# Patient Record
Sex: Female | Born: 1980
Health system: Southern US, Community
[De-identification: ages and names within clinical notes are randomized; demographics above are authoritative.]

## PROBLEM LIST (undated history)

## (undated) DIAGNOSIS — F988 Other specified behavioral and emotional disorders with onset usually occurring in childhood and adolescence: Secondary | ICD-10-CM

## (undated) DIAGNOSIS — Z8619 Personal history of other infectious and parasitic diseases: Secondary | ICD-10-CM

## (undated) DIAGNOSIS — N6001 Solitary cyst of right breast: Secondary | ICD-10-CM

## (undated) DIAGNOSIS — F419 Anxiety disorder, unspecified: Secondary | ICD-10-CM

## (undated) DIAGNOSIS — K649 Unspecified hemorrhoids: Secondary | ICD-10-CM

## (undated) DIAGNOSIS — R87612 Low grade squamous intraepithelial lesion on cytologic smear of cervix (LGSIL): Secondary | ICD-10-CM

## (undated) HISTORY — DX: Anxiety disorder, unspecified: F41.9

## (undated) HISTORY — DX: Low grade squamous intraepithelial lesion on cytologic smear of cervix (LGSIL): R87.612

## (undated) HISTORY — DX: Other specified behavioral and emotional disorders with onset usually occurring in childhood and adolescence: F98.8

## (undated) HISTORY — DX: Solitary cyst of right breast: N60.01

## (undated) HISTORY — DX: Unspecified hemorrhoids: K64.9

## (undated) HISTORY — PX: TONSILLECTOMY AND ADENOIDECTOMY: SUR1326

## (undated) HISTORY — DX: Personal history of other infectious and parasitic diseases: Z86.19

## (undated) HISTORY — PX: EXTERNAL EAR SURGERY: SHX627

---

## 2003-02-17 DIAGNOSIS — R87612 Low grade squamous intraepithelial lesion on cytologic smear of cervix (LGSIL): Secondary | ICD-10-CM

## 2003-02-17 HISTORY — DX: Low grade squamous intraepithelial lesion on cytologic smear of cervix (LGSIL): R87.612

## 2006-01-23 ENCOUNTER — Ambulatory Visit: Payer: Self-pay

## 2006-04-25 ENCOUNTER — Ambulatory Visit: Payer: Self-pay

## 2007-06-12 ENCOUNTER — Ambulatory Visit: Payer: Self-pay

## 2011-09-25 DIAGNOSIS — Z8619 Personal history of other infectious and parasitic diseases: Secondary | ICD-10-CM

## 2011-09-25 HISTORY — DX: Personal history of other infectious and parasitic diseases: Z86.19

## 2013-02-23 LAB — HM PAP SMEAR: HM PAP: NORMAL

## 2013-11-12 ENCOUNTER — Encounter: Payer: Self-pay | Admitting: Internal Medicine

## 2013-11-12 ENCOUNTER — Ambulatory Visit (INDEPENDENT_AMBULATORY_CARE_PROVIDER_SITE_OTHER): Payer: BC Managed Care – PPO | Admitting: Internal Medicine

## 2013-11-12 VITALS — BP 124/98 | HR 85 | Temp 98.7°F | Ht 69.25 in | Wt 174.0 lb

## 2013-11-12 DIAGNOSIS — F17200 Nicotine dependence, unspecified, uncomplicated: Secondary | ICD-10-CM

## 2013-11-12 DIAGNOSIS — E663 Overweight: Secondary | ICD-10-CM

## 2013-11-12 DIAGNOSIS — R202 Paresthesia of skin: Secondary | ICD-10-CM

## 2013-11-12 DIAGNOSIS — R209 Unspecified disturbances of skin sensation: Secondary | ICD-10-CM

## 2013-11-12 DIAGNOSIS — F909 Attention-deficit hyperactivity disorder, unspecified type: Secondary | ICD-10-CM

## 2013-11-12 DIAGNOSIS — F411 Generalized anxiety disorder: Secondary | ICD-10-CM

## 2013-11-12 NOTE — Progress Notes (Signed)
HPI  Pt presents to the clinic today to establish care. She does not have a PCP. She gets most of her care from [facility]. She does have some concerns today about facial pain. She report the left side of her face has been tingling for 2 days. The tingling has stopped. She has not broken out in a rash. She does have a history of shingles.  Flu: 07/25/2013 Tetanus: more than 10 years ago LMP: 10/29/13 Pap Smear: 2014 Dentist: unsure  Past Medical History  Diagnosis Date  . History of shingles 2013    Current Outpatient Prescriptions  Medication Sig Dispense Refill  . nicotine polacrilex (NICORETTE) 4 MG gum Take 4 mg by mouth as needed for smoking cessation.       No current facility-administered medications for this visit.    Allergies  Allergen Reactions  . Augmentin [Amoxicillin-Pot Clavulanate] Rash  . Codeine Nausea And Vomiting and Rash    Family History  Problem Relation Age of Onset  . Heart disease Paternal Grandmother   . Heart disease Paternal Grandfather     History   Social History  . Marital Status: Single    Spouse Name: N/A    Number of Children: N/A  . Years of Education: N/A   Occupational History  . Not on file.   Social History Main Topics  . Smoking status: Current Every Day Smoker -- 0.25 packs/day for 10 years    Types: Cigarettes  . Smokeless tobacco: Never Used     Comment: patient is using Nicorette  . Alcohol Use: Yes     Comment: moderate  . Drug Use: No  . Sexual Activity: Not on file   Other Topics Concern  . Not on file   Social History Narrative  . No narrative on file    ROS:  Constitutional: Denies fever, malaise, fatigue, headache or abrupt weight changes.  Respiratory: Denies difficulty breathing, shortness of breath, cough or sputum production.   Cardiovascular: Denies chest pain, chest tightness, palpitations or swelling in the hands or feet.  Skin: Denies redness, rashes, lesions or ulcercations.   Neurological: Pt reports tingling sensation of face. Denies dizziness, difficulty with memory, difficulty with speech or problems with balance and coordination.   No other specific complaints in a complete review of systems (except as listed in HPI above).  PE:  BP 124/98  Pulse 85  Temp(Src) 98.7 F (37.1 C) (Oral)  Ht 5' 9.25" (1.759 m)  Wt 174 lb (78.926 kg)  BMI 25.51 kg/m2  SpO2 99%  LMP 10/29/2013 Wt Readings from Last 3 Encounters:  11/12/13 174 lb (78.926 kg)    General: Appears her stated age, well developed, well nourished in NAD. Cardiovascular: Normal rate and rhythm. S1,S2 noted.  No murmur, rubs or gallops noted. No JVD or BLE edema. No carotid bruits noted. Pulmonary/Chest: Normal effort and positive vesicular breath sounds. No respiratory distress. No wheezes, rales or ronchi noted. Skin: Warm, dry and intact. No rashes, lesions or ulcerations noted.  Neurological: Alert and oriented. Cranial nerves II-XII intact. Coordination normal. +DTRs bilaterally. Psychiatric: Mood and affect normal. Behavior is normal. Judgment and thought content normal.      Assessment and Plan:  Paresthesia of face:  I do not think she is having a recurrent shingles episode ? Stress related No indication for valtrex at this time Advised her that she could not get the shingles vaccine until > 60  Health Maintenance:   She declines Tdap today Advised her  to work on diet and exercise Encouraged her to keep using the nicorette to quit smoking  RTC in 6 months for your physical exam

## 2013-11-12 NOTE — Patient Instructions (Addendum)
Paresthesia °Paresthesia is an abnormal burning or prickling sensation. This sensation is generally felt in the hands, arms, legs, or feet. However, it may occur in any part of the body. It is usually not painful. The feeling may be described as: °· Tingling or numbness. °· "Pins and needles." °· Skin crawling. °· Buzzing. °· Limbs "falling asleep." °· Itching. °Most people experience temporary (transient) paresthesia at some time in their lives. °CAUSES  °Paresthesia may occur when you breathe too quickly (hyperventilation). It can also occur without any apparent cause. Commonly, paresthesia occurs when pressure is placed on a nerve. The feeling quickly goes away once the pressure is removed. For some people, however, paresthesia is a long-lasting (chronic) condition caused by an underlying disorder. The underlying disorder may be: °· A traumatic, direct injury to nerves. Examples include a: °· Broken (fractured) neck. °· Fractured skull. °· A disorder affecting the brain and spinal cord (central nervous system). Examples include: °· Transverse myelitis. °· Encephalitis. °· Transient ischemic attack. °· Multiple sclerosis. °· Stroke. °· Tumor or blood vessel problems, such as an arteriovenous malformation pressing against the brain or spinal cord. °· A condition that damages the peripheral nerves (peripheral neuropathy). Peripheral nerves are not part of the brain and spinal cord. These conditions include: °· Diabetes. °· Peripheral vascular disease. °· Nerve entrapment syndromes, such as carpal tunnel syndrome. °· Shingles. °· Hypothyroidism. °· Vitamin B12 deficiencies. °· Alcoholism. °· Heavy metal poisoning (lead, arsenic). °· Rheumatoid arthritis. °· Systemic lupus erythematosus. °DIAGNOSIS  °Your caregiver will attempt to find the underlying cause of your paresthesia. Your caregiver may: °· Take your medical history. °· Perform a physical exam. °· Order various lab tests. °· Order imaging tests. °TREATMENT    °Treatment for paresthesia depends on the underlying cause. °HOME CARE INSTRUCTIONS °· Avoid drinking alcohol. °· You may consider massage or acupuncture to help relieve your symptoms. °· Keep all follow-up appointments as directed by your caregiver. °SEEK IMMEDIATE MEDICAL CARE IF:  °· You feel weak. °· You have trouble walking or moving. °· You have problems with speech or vision. °· You feel confused. °· You cannot control your bladder or bowel movements. °· You feel numbness after an injury. °· You faint. °· Your burning or prickling feeling gets worse when walking. °· You have pain, cramps, or dizziness. °· You develop a rash. °MAKE SURE YOU: °· Understand these instructions. °· Will watch your condition. °· Will get help right away if you are not doing well or get worse. °Document Released: 08/31/2002 Document Revised: 12/03/2011 Document Reviewed: 06/01/2011 °ExitCare® Patient Information ©2014 [employer]. ° °

## 2013-11-12 NOTE — Assessment & Plan Note (Signed)
She is using holistic treatments at this time She is not interested in medicinal treatment I think she would benefit from CBT

## 2013-11-12 NOTE — Assessment & Plan Note (Signed)
Was on Adderall for the last 10 years She is not interested in restarting it

## 2013-11-12 NOTE — Progress Notes (Signed)
Pre-visit discussion using our clinic review tool. No additional management support is needed unless otherwise documented below in the visit note.  

## 2014-05-27 ENCOUNTER — Emergency Department: Payer: Self-pay | Admitting: Emergency Medicine

## 2014-05-27 LAB — CBC WITH DIFFERENTIAL/PLATELET
BASOS ABS: 0.1 10*3/uL (ref 0.0–0.1)
Basophil %: 0.7 %
EOS ABS: 0.3 10*3/uL (ref 0.0–0.7)
EOS PCT: 2.4 %
HCT: 51 % — ABNORMAL HIGH (ref 35.0–47.0)
HGB: 16.5 g/dL — AB (ref 12.0–16.0)
LYMPHS PCT: 32.8 %
Lymphocyte #: 3.8 10*3/uL — ABNORMAL HIGH (ref 1.0–3.6)
MCH: 31 pg (ref 26.0–34.0)
MCHC: 32.4 g/dL (ref 32.0–36.0)
MCV: 96 fL (ref 80–100)
MONO ABS: 0.7 x10 3/mm (ref 0.2–0.9)
Monocyte %: 5.9 %
Neutrophil #: 6.7 10*3/uL — ABNORMAL HIGH (ref 1.4–6.5)
Neutrophil %: 58.2 %
Platelet: 352 10*3/uL (ref 150–440)
RBC: 5.33 10*6/uL — ABNORMAL HIGH (ref 3.80–5.20)
RDW: 12.6 % (ref 11.5–14.5)
WBC: 11.6 10*3/uL — AB (ref 3.6–11.0)

## 2014-05-27 LAB — COMPREHENSIVE METABOLIC PANEL
ALBUMIN: 4.1 g/dL (ref 3.4–5.0)
ALT: 21 U/L
ANION GAP: 10 (ref 7–16)
Alkaline Phosphatase: 59 U/L
BUN: 9 mg/dL (ref 7–18)
Bilirubin,Total: 0.5 mg/dL (ref 0.2–1.0)
Calcium, Total: 8.9 mg/dL (ref 8.5–10.1)
Chloride: 107 mmol/L (ref 98–107)
Co2: 24 mmol/L (ref 21–32)
Creatinine: 0.84 mg/dL (ref 0.60–1.30)
EGFR (African American): >60
EGFR (Non-African Amer.): >60
Glucose: 96 mg/dL (ref 65–99)
Osmolality: 280 (ref 275–301)
Potassium: 3.5 mmol/L (ref 3.5–5.1)
SGOT(AST): 21 U/L (ref 15–37)
SODIUM: 141 mmol/L (ref 136–145)
Total Protein: 7.1 g/dL (ref 6.4–8.2)

## 2014-05-27 LAB — PREGNANCY, URINE: Pregnancy Test, Urine: NEGATIVE m[IU]/mL

## 2014-05-27 LAB — LIPASE, BLOOD: Lipase: 153 U/L (ref 73–393)

## 2014-05-27 LAB — URINALYSIS, COMPLETE
Bacteria: NONE SEEN
Bilirubin,UR: NEGATIVE
GLUCOSE, UR: NEGATIVE mg/dL (ref 0–75)
KETONE: NEGATIVE
Leukocyte Esterase: NEGATIVE
NITRITE: NEGATIVE
PH: 6 (ref 4.5–8.0)
PROTEIN: NEGATIVE
SPECIFIC GRAVITY: 1.017 (ref 1.003–1.030)
WBC UR: 1 /HPF (ref 0–5)

## 2014-05-27 LAB — ETHANOL: ETHANOL LVL: 26 mg/dL

## 2015-06-30 ENCOUNTER — Ambulatory Visit (INDEPENDENT_AMBULATORY_CARE_PROVIDER_SITE_OTHER): Payer: BLUE CROSS/BLUE SHIELD | Admitting: Internal Medicine

## 2015-06-30 ENCOUNTER — Ambulatory Visit (INDEPENDENT_AMBULATORY_CARE_PROVIDER_SITE_OTHER): Payer: BLUE CROSS/BLUE SHIELD

## 2015-06-30 ENCOUNTER — Encounter: Payer: Self-pay | Admitting: Internal Medicine

## 2015-06-30 VITALS — BP 120/88 | HR 87 | Temp 98.4°F | Wt 191.0 lb

## 2015-06-30 DIAGNOSIS — M79605 Pain in left leg: Secondary | ICD-10-CM | POA: Diagnosis not present

## 2015-06-30 NOTE — Progress Notes (Signed)
Subjective:    Patient ID: Julie Jimenez, female    DOB: 11-09-1980, 34 y.o.   MRN: [id number]  HPI  Pt presents to the clinic today with c/o left leg pain. She noticed this 1 week ago. She describes the pain as a pinching and stinging sensation behind her left knee. The pain can occur at rest or while walking. She has not noticed any swelling, numbness or tingling in the left leg. She denies any injury to the area. She has been wearing a compression stocking with some relief. She is concerned that she may have a DVT in her leg because she is a smoker. She is feeling very anxious about this. She does feel a flutter in her chest over the last week but denies chest pain or shortness of breath. She does have anxiety but has been treating herself homeopathically  for this.  Review of Systems      Past Medical History  Diagnosis Date  . History of shingles 2013    Current Outpatient Prescriptions  Medication Sig Dispense Refill  . nicotine polacrilex (NICORETTE) 4 MG gum Take 4 mg by mouth as needed for smoking cessation.     No current facility-administered medications for this visit.    Allergies  Allergen Reactions  . Augmentin [Amoxicillin-Pot Clavulanate] Rash  . Codeine Nausea And Vomiting and Rash    Family History  Problem Relation Age of Onset  . Heart disease Paternal Grandmother   . Heart disease Paternal Grandfather     Social History   Social History  . Marital Status: Single    Spouse Name: N/A  . Number of Children: N/A  . Years of Education: N/A   Occupational History  . Not on file.   Social History Main Topics  . Smoking status: Current Every Day Smoker -- 0.50 packs/day for 10 years    Types: Cigarettes  . Smokeless tobacco: Never Used     Comment: patient is using Nicorette  . Alcohol Use: 0.0 oz/week    0 Standard drinks or equivalent per week     Comment: moderate  . Drug Use: No  . Sexual Activity: Not on file   Other Topics Concern  . Not  on file   Social History Narrative     Constitutional: Denies fever, malaise, fatigue, headache or abrupt weight changes.  Respiratory: Denies difficulty breathing, shortness of breath, cough or sputum production.   Cardiovascular: Denies chest pain, chest tightness, palpitations or swelling in the hands or feet.  Musculoskeletal: Pt reports left leg pain. Denies decrease in range of motion, difficulty with gait, muscle pain or joint pain and swelling.  Skin: Denies redness, rashes, lesions or ulcercations.  Psych: Pt reports anxiety. Denies depression, SI/HI.  No other specific complaints in a complete review of systems (except as listed in HPI above).  Objective:   Physical Exam   BP 120/88 mmHg  Pulse 87  Temp(Src) 98.4 F (36.9 C) (Oral)  Wt 191 lb (86.637 kg)  SpO2 98%  LMP 06/26/2015 Wt Readings from Last 3 Encounters:  06/30/15 191 lb (86.637 kg)  11/12/13 174 lb (78.926 kg)    General: Appears her stated age, well developed, well nourished in NAD. Skin: Warm, dry and intact. No redness or warmth noted. Cardiovascular: Normal rate and rhythm. S1,S2 noted. Negative Homan's sign.  Pulmonary/Chest: Normal effort and positive vesicular breath sounds. No respiratory distress. No wheezes, rales or ronchi noted.  Musculoskeletal: Normal flexion, extension of the left knee.  No pain with palpation of the popliteal fossa. No difficulty with gait.  Neurological: Alert and oriented.  Psychiatric: She is very anxious today.  BMET    Component Value Date/Time   NA 141 05/27/2014 0122   K 3.5 05/27/2014 0122   CL 107 05/27/2014 0122   CO2 24 05/27/2014 0122   GLUCOSE 96 05/27/2014 0122   BUN 9 05/27/2014 0122   CREATININE 0.84 05/27/2014 0122   CALCIUM 8.9 05/27/2014 0122   GFRNONAA >60 05/27/2014 0122   GFRAA >60 05/27/2014 0122    Lipid Panel  No results found for: CHOL, TRIG, HDL, CHOLHDL, VLDL, LDLCALC  CBC    Component Value Date/Time   WBC 11.6* 05/27/2014  0122   RBC 5.33* 05/27/2014 0122   HGB 16.5* 05/27/2014 0122   HCT 51.0* 05/27/2014 0122   PLT 352 05/27/2014 0122   MCV 96 05/27/2014 0122   MCH 31.0 05/27/2014 0122   MCHC 32.4 05/27/2014 0122   RDW 12.6 05/27/2014 0122   LYMPHSABS 3.8* 05/27/2014 0122   MONOABS 0.7 05/27/2014 0122   EOSABS 0.3 05/27/2014 0122   BASOSABS 0.1 05/27/2014 0122    Hgb A1C No results found for: HGBA1C      Assessment & Plan:   Left leg pain:  I really think this is benign She is insistent on getting a ultrasound to r/o DVT Ultrasound ordered today- see Shirlee Limerick on the way out to schedule  Will follow up after ultrasound, RTC as needed or if symptoms persist or worsen

## 2015-06-30 NOTE — Patient Instructions (Signed)
Leg Cramps Leg cramps occur when a muscle or muscles tighten and you have no control over this tightening (involuntary muscle contraction). Muscle cramps can develop in any muscle, but the most common place is in the calf muscles of the leg. Those cramps can occur during exercise or when you are at rest. Leg cramps are painful, and they may last for a few seconds to a few minutes. Cramps may return several times before they finally stop. Usually, leg cramps are not caused by a serious medical problem. In many cases, the cause is not known. Some common causes include:  Overexertion.  Overuse from repetitive motions, or doing the same thing over and over.  Remaining in a certain position for a long period of time.  Improper preparation, form, or technique while performing a sport or an activity.  Dehydration.  Injury.  Side effects of some medicines.  Abnormally low levels of the salts and ions in your blood (electrolytes), especially potassium and calcium. These levels could be low if you are taking water pills (diuretics) or if you are pregnant. HOME CARE INSTRUCTIONS Watch your condition for any changes. Taking the following actions may help to lessen any discomfort that you are feeling:  Stay well-hydrated. Drink enough fluid to keep your urine clear or pale yellow.  Try massaging, stretching, and relaxing the affected muscle. Do this for several minutes at a time.  For tight or tense muscles, use a warm towel, heating pad, or hot shower water directed to the affected area.  If you are sore or have pain after a cramp, applying ice to the affected area may relieve discomfort.  Put ice in a plastic bag.  Place a towel between your skin and the bag.  Leave the ice on for 20 minutes, 2-3 times per day.  Avoid strenuous exercise for several days if you have been having frequent leg cramps.  Make sure that your diet includes the essential minerals for your muscles to work  normally.  Take medicines only as directed by your health care provider. SEEK MEDICAL CARE IF:  Your leg cramps get more severe or more frequent, or they do not improve over time.  Your foot becomes cold, numb, or blue.   This information is not intended to replace advice given to you by your health care provider. Make sure you discuss any questions you have with your health care provider.   Document Released: 10/18/2004 Document Revised: 01/25/2015 Document Reviewed: 08/18/2014 Elsevier Interactive Patient Education 2016 [employer]   

## 2015-06-30 NOTE — Progress Notes (Signed)
   Subjective:    Patient ID: Julie Jimenez, female    DOB: 11/26/1980, 34 y.o.   MRN: [id number]  HPI [name] is a 34 year old female who presents today with chief complaint of pain in the left leg behind her knee.  Pain began 2 weeks ago and she describes the pain as "pinching" and dull.  She does not know if it is worse with walking or with rest. She is very concerned that she has a DVT and has a very high anxiety level.  She is a current smoker.  She has not taken anything over the counter for the pain.    Review of Systems  Constitutional: Negative for fever, chills and appetite change.  HENT: Negative for congestion, postnasal drip, rhinorrhea and sore throat.   Respiratory: Negative for cough, shortness of breath and wheezing.   Cardiovascular: Negative for chest pain, palpitations and leg swelling.  Gastrointestinal: Negative for abdominal pain.  Genitourinary: Negative for dysuria, frequency and flank pain.  Musculoskeletal: Negative for myalgias and arthralgias.  Skin: Negative.    Family History  Problem Relation Age of Onset  . Heart disease Paternal Grandmother   . Heart disease Paternal Grandfather    Current Outpatient Prescriptions on File Prior to Visit  Medication Sig Dispense Refill  . nicotine polacrilex (NICORETTE) 4 MG gum Take 4 mg by mouth as needed for smoking cessation.     No current facility-administered medications on file prior to visit.       Objective:   Physical Exam  Constitutional: She is oriented to person, place, and time. She appears well-developed and well-nourished. No distress.  HENT:  Head: Normocephalic and atraumatic.  Right Ear: External ear normal.  Left Ear: External ear normal.  Mouth/Throat: No oropharyngeal exudate.  Neck: Normal range of motion. Neck supple.  Cardiovascular: Normal rate, regular rhythm, normal heart sounds and intact distal pulses.   No murmur heard. Pulmonary/Chest: Effort normal and breath sounds normal.    Abdominal: Soft. Bowel sounds are normal.  Musculoskeletal: Normal range of motion.  Lymphadenopathy:    She has no cervical adenopathy.  Neurological: She is alert and oriented to person, place, and time.  Skin: Skin is warm and dry. She is not diaphoretic. No erythema.    BP 120/88 mmHg  Pulse 87  Temp(Src) 98.4 F (36.9 C) (Oral)  Wt 191 lb (86.637 kg)  SpO2 98%  LMP 06/26/2015       Assessment & Plan:  1. Left leg pain Will doppler to rule out DVT.  May take Ibuprofen for pain.

## 2015-06-30 NOTE — Progress Notes (Signed)
Pre visit review using our clinic review tool, if applicable. No additional management support is needed unless otherwise documented below in the visit note. 

## 2015-07-08 ENCOUNTER — Ambulatory Visit (INDEPENDENT_AMBULATORY_CARE_PROVIDER_SITE_OTHER): Payer: BLUE CROSS/BLUE SHIELD | Admitting: Internal Medicine

## 2015-07-08 ENCOUNTER — Encounter: Payer: Self-pay | Admitting: Internal Medicine

## 2015-07-08 VITALS — BP 120/86 | HR 85 | Temp 98.4°F | Ht 69.0 in | Wt 188.0 lb

## 2015-07-08 DIAGNOSIS — Z0000 Encounter for general adult medical examination without abnormal findings: Secondary | ICD-10-CM | POA: Diagnosis not present

## 2015-07-08 LAB — CBC
HCT: 46.7 % — ABNORMAL HIGH (ref 36.0–46.0)
Hemoglobin: 15.9 g/dL — ABNORMAL HIGH (ref 12.0–15.0)
MCH: 30.7 pg (ref 26.0–34.0)
MCHC: 34 g/dL (ref 30.0–36.0)
MCV: 90.2 fL (ref 78.0–100.0)
MPV: 9.2 fL (ref 8.6–12.4)
Platelets: 348 10*3/uL (ref 150–400)
RBC: 5.18 MIL/uL — ABNORMAL HIGH (ref 3.87–5.11)
RDW: 13.3 % (ref 11.5–15.5)
WBC: 12.1 10*3/uL — AB (ref 4.0–10.5)

## 2015-07-08 LAB — HEMOGLOBIN A1C
HEMOGLOBIN A1C: 5.1 % (ref <5.7)
Mean Plasma Glucose: 100 mg/dL (ref <117)

## 2015-07-08 LAB — LIPID PANEL
CHOL/HDL RATIO: 3.9 ratio (ref <=5.0)
Cholesterol: 174 mg/dL (ref 125–200)
HDL: 45 mg/dL — AB (ref >=46)
LDL CALC: 119 mg/dL (ref <130)
Triglycerides: 50 mg/dL (ref <150)
VLDL: 10 mg/dL (ref <30)

## 2015-07-08 LAB — COMPREHENSIVE METABOLIC PANEL
ALK PHOS: 64 U/L (ref 33–115)
ALT: 20 U/L (ref 6–29)
AST: 22 U/L (ref 10–30)
Albumin: 4.3 g/dL (ref 3.6–5.1)
BUN: 5 mg/dL — AB (ref 7–25)
CO2: 21 mmol/L (ref 20–31)
Calcium: 9.3 mg/dL (ref 8.6–10.2)
Chloride: 102 mmol/L (ref 98–110)
Creat: 0.7 mg/dL (ref 0.50–1.10)
GLUCOSE: 78 mg/dL (ref 65–99)
POTASSIUM: 4.3 mmol/L (ref 3.5–5.3)
Sodium: 139 mmol/L (ref 135–146)
Total Bilirubin: 0.8 mg/dL (ref 0.2–1.2)
Total Protein: 6.3 g/dL (ref 6.1–8.1)

## 2015-07-08 LAB — TSH: TSH: 2.471 u[IU]/mL (ref 0.350–4.500)

## 2015-07-08 NOTE — Addendum Note (Signed)
Addended by: Lorre MunroeBAITY, Tziporah Knoke W on: 07/08/2015 03:06 PM   Modules accepted: Kipp BroodSmartSet

## 2015-07-08 NOTE — Progress Notes (Signed)
Subjective:    Patient ID: Julie Jimenez, female    DOB: [birth year], 34 y.o.   MRN: [id number]  HPI  Pt presents to the clinic today for her annual exam.  Flu: 2014 Tetanus: > 10 years ago Pap Smear: 2014 Dentist: biannually  Diet: She tries to avoid dairy and sugar. She tries to avoid fried foods. She eats lots of organic fruits and veggies. She drinks mostly water and beer. Exercise: She does not exercise.  Review of Systems   Past Medical History  Diagnosis Date  . History of shingles 2013    Current Outpatient Prescriptions  Medication Sig Dispense Refill  . nicotine polacrilex (NICORETTE) 4 MG gum Take 4 mg by mouth as needed for smoking cessation.     No current facility-administered medications for this visit.    Allergies  Allergen Reactions  . Augmentin [Amoxicillin-Pot Clavulanate] Rash  . Codeine Nausea And Vomiting and Rash    Family History  Problem Relation Age of Onset  . Heart disease Paternal Grandmother   . Heart disease Paternal Grandfather     Social History   Social History  . Marital Status: Single    Spouse Name: N/A  . Number of Children: N/A  . Years of Education: N/A   Occupational History  . Not on file.   Social History Main Topics  . Smoking status: Current Every Day Smoker -- 0.50 packs/day for 10 years    Types: Cigarettes  . Smokeless tobacco: Never Used     Comment: patient is using Nicorette  . Alcohol Use: 0.0 oz/week    0 Standard drinks or equivalent per week     Comment: moderate  . Drug Use: No  . Sexual Activity: Not on file   Other Topics Concern  . Not on file   Social History Narrative     Constitutional: Denies fever, malaise, fatigue, headache or abrupt weight changes.  HEENT: Denies eye pain, eye redness, ear pain, ringing in the ears, wax buildup, runny nose, nasal congestion, bloody nose, or sore throat. Respiratory: Denies difficulty breathing, shortness of breath, cough or sputum production.     Cardiovascular: Denies chest pain, chest tightness, palpitations or swelling in the hands or feet.  Gastrointestinal: Denies abdominal pain, bloating, constipation, diarrhea or blood in the stool.  GU: Denies urgency, frequency, pain with urination, burning sensation, blood in urine, odor or discharge. Musculoskeletal: Denies decrease in range of motion, difficulty with gait, muscle pain or joint pain and swelling.  Skin: Denies redness, rashes, lesions or ulcercations.  Neurological: Denies dizziness, difficulty with memory, difficulty with speech or problems with balance and coordination.  Psych: Pt reports anxiety. Denies depression, SI/HI.  No other specific complaints in a complete review of systems (except as listed in HPI above).     Objective:   Physical Exam  BP 120/86 mmHg  Pulse 85  Temp(Src) 98.4 F (36.9 C) (Oral)  Ht  (1.753 m)  Wt 188 lb (85.276 kg)  BMI 27.75 kg/m2  SpO2 99%  LMP 06/26/2015 Wt Readings from Last 3 Encounters:  07/08/15 188 lb (85.276 kg)  06/30/15 191 lb (86.637 kg)  11/12/13 174 lb (78.926 kg)    General: Appears her stated age, well developed, well nourished in NAD. Skin: Warm, dry and intact. No rashes, lesions or ulcerations noted. HEENT: Head: normal shape and size; Eyes: sclera white, no icterus, conjunctiva pink, PERRLA and EOMs intact; Ears: Tm's gray and intact, normal light reflex;  Throat/Mouth: Teeth  present, mucosa pink and moist, no exudate, lesions or ulcerations noted.  Neck:  Neck supple, trachea midline. No masses, lumps or thyromegaly present.  Cardiovascular: Normal rate and rhythm. S1,S2 noted.  No murmur, rubs or gallops noted. No JVD or BLE edema. No carotid bruits noted. Pulmonary/Chest: Normal effort and positive vesicular breath sounds. No respiratory distress. No wheezes, rales or ronchi noted.  Abdomen: Soft and nontender. Normal bowel sounds. No distention or masses noted. Liver, spleen and kidneys non  palpable. Musculoskeletal: Strength 5/5 BUE/BLE. No signs of joint swelling. No difficulty with gait.  Neurological: Alert and oriented. Cranial nerves II-XII grossly intact. Coordination normal.  Psychiatric: Mood and affect normal. Behavior is normal. Judgment and thought content normal.     BMET    Component Value Date/Time   NA 141 05/27/2014 0122   K 3.5 05/27/2014 0122   CL 107 05/27/2014 0122   CO2 24 05/27/2014 0122   GLUCOSE 96 05/27/2014 0122   BUN 9 05/27/2014 0122   CREATININE 0.84 05/27/2014 0122   CALCIUM 8.9 05/27/2014 0122   GFRNONAA >60 05/27/2014 0122   GFRAA >60 05/27/2014 0122    Lipid Panel  No results found for: CHOL, TRIG, HDL, CHOLHDL, VLDL, LDLCALC  CBC    Component Value Date/Time   WBC 11.6* 05/27/2014 0122   RBC 5.33* 05/27/2014 0122   HGB 16.5* 05/27/2014 0122   HCT 51.0* 05/27/2014 0122   PLT 352 05/27/2014 0122   MCV 96 05/27/2014 0122   MCH 31.0 05/27/2014 0122   MCHC 32.4 05/27/2014 0122   RDW 12.6 05/27/2014 0122   LYMPHSABS 3.8* 05/27/2014 0122   MONOABS 0.7 05/27/2014 0122   EOSABS 0.3 05/27/2014 0122   BASOSABS 0.1 05/27/2014 0122    Hgb A1C No results found for: HGBA1C        Assessment & Plan:   Preventative Health Maintenance:  She declines flu and Tdap today Encourage her to consume a balanced diet and start an exercise regimen Stop smoking She will have her pap at [facility]YN Encouraged her to see an dentist at least bianually CBC, CMET, Lipid, A1C and TSH today  RTC in 1 year or sooner if needed

## 2015-07-08 NOTE — Patient Instructions (Signed)
Health Maintenance, Female Adopting a healthy lifestyle and getting preventive care can go a long way to promote health and wellness. Talk with your health care provider about what schedule of regular examinations is right for you. This is a good chance for you to check in with your provider about disease prevention and staying healthy. In between checkups, there are plenty of things you can do on your own. Experts have done a lot of research about which lifestyle changes and preventive measures are most likely to keep you healthy. Ask your health care provider for more information. WEIGHT AND DIET  Eat a healthy diet  Be sure to include plenty of vegetables, fruits, low-fat dairy products, and lean protein.  Do not eat a lot of foods high in solid fats, added sugars, or salt.  Get regular exercise. This is one of the most important things you can do for your health.  Most adults should exercise for at least 150 minutes each week. The exercise should increase your heart rate and make you sweat (moderate-intensity exercise).  Most adults should also do strengthening exercises at least twice a week. This is in addition to the moderate-intensity exercise.  Maintain a healthy weight  Body mass index (BMI) is a measurement that can be used to identify possible weight problems. It estimates body fat based on height and weight. Your health care provider can help determine your BMI and help you achieve or maintain a healthy weight.  For females 60 years of age and older:   A BMI below 18.5 is considered underweight.  A BMI of 18.5 to 24.9 is normal.  A BMI of 25 to 29.9 is considered overweight.  A BMI of 30 and above is considered obese.  Watch levels of cholesterol and blood lipids  You should start having your blood tested for lipids and cholesterol at 34 years of age, then have this test every 5 years.  You may need to have your cholesterol levels checked more often if:  Your lipid  or cholesterol levels are high.  You are older than 34 years of age.  You are at high risk for heart disease.  CANCER SCREENING   Lung Cancer  Lung cancer screening is recommended for adults 4-56 years old who are at high risk for lung cancer because of a history of smoking.  A yearly low-dose CT scan of the lungs is recommended for people who:  Currently smoke.  Have quit within the past 15 years.  Have at least a 30-pack-year history of smoking. A pack year is smoking an average of one pack of cigarettes a day for 1 year.  Yearly screening should continue until it has been 15 years since you quit.  Yearly screening should stop if you develop a health problem that would prevent you from having lung cancer treatment.  Breast Cancer  Practice breast self-awareness. This means understanding how your breasts normally appear and feel.  It also means doing regular breast self-exams. Let your health care provider know about any changes, no matter how small.  If you are in your 20s or 30s, you should have a clinical breast exam (CBE) by a health care provider every 1-3 years as part of a regular health exam.  If you are 56 or older, have a CBE every year. Also consider having a breast X-ray (mammogram) every year.  If you have a family history of breast cancer, talk to your health care provider about genetic screening.  If you  are at high risk for breast cancer, talk to your health care provider about having an MRI and a mammogram every year.  Breast cancer gene (BRCA) assessment is recommended for women who have family members with BRCA-related cancers. BRCA-related cancers include:  Breast.  Ovarian.  Tubal.  Peritoneal cancers.  Results of the assessment will determine the need for genetic counseling and BRCA1 and BRCA2 testing. Cervical Cancer Your health care provider may recommend that you be screened regularly for cancer of the pelvic organs (ovaries, uterus, and  vagina). This screening involves a pelvic examination, including checking for microscopic changes to the surface of your cervix (Pap test). You may be encouraged to have this screening done every 3 years, beginning at age 109.  For women ages 12-65, health care providers may recommend pelvic exams and Pap testing every 3 years, or they may recommend the Pap and pelvic exam, combined with testing for human papilloma virus (HPV), every 5 years. Some types of HPV increase your risk of cervical cancer. Testing for HPV may also be done on women of any age with unclear Pap test results.  Other health care providers may not recommend any screening for nonpregnant women who are considered low risk for pelvic cancer and who do not have symptoms. Ask your health care provider if a screening pelvic exam is right for you.  If you have had past treatment for cervical cancer or a condition that could lead to cancer, you need Pap tests and screening for cancer for at least 20 years after your treatment. If Pap tests have been discontinued, your risk factors (such as having a new sexual partner) need to be reassessed to determine if screening should resume. Some women have medical problems that increase the chance of getting cervical cancer. In these cases, your health care provider may recommend more frequent screening and Pap tests. Colorectal Cancer  This type of cancer can be detected and often prevented.  Routine colorectal cancer screening usually begins at 34 years of age and continues through 34 years of age.  Your health care provider may recommend screening at an earlier age if you have risk factors for colon cancer.  Your health care provider may also recommend using home test kits to check for hidden blood in the stool.  A small camera at the end of a tube can be used to examine your colon directly (sigmoidoscopy or colonoscopy). This is done to check for the earliest forms of colorectal  cancer.  Routine screening usually begins at age 17.  Direct examination of the colon should be repeated every 5-10 years through 34 years of age. However, you may need to be screened more often if early forms of precancerous polyps or small growths are found. Skin Cancer  Check your skin from head to toe regularly.  Tell your health care provider about any new moles or changes in moles, especially if there is a change in a mole's shape or color.  Also tell your health care provider if you have a mole that is larger than the size of a pencil eraser.  Always use sunscreen. Apply sunscreen liberally and repeatedly throughout the day.  Protect yourself by wearing long sleeves, pants, a wide-brimmed hat, and sunglasses whenever you are outside. HEART DISEASE, DIABETES, AND HIGH BLOOD PRESSURE   High blood pressure causes heart disease and increases the risk of stroke. High blood pressure is more likely to develop in:  People who have blood pressure in the high end  of the normal range (130-139/85-89 mm Hg).  People who are overweight or obese.  People who are African American.  If you are 57-56 years of age, have your blood pressure checked every 3-5 years. If you are 85 years of age or older, have your blood pressure checked every year. You should have your blood pressure measured twice--once when you are at a hospital or clinic, and once when you are not at a hospital or clinic. Record the average of the two measurements. To check your blood pressure when you are not at a hospital or clinic, you can use:  An automated blood pressure machine at a pharmacy.  A home blood pressure monitor.  If you are between 1 years and 87 years old, ask your health care provider if you should take aspirin to prevent strokes.  Have regular diabetes screenings. This involves taking a blood sample to check your fasting blood sugar level.  If you are at a normal weight and have a low risk for diabetes,  have this test once every three years after 34 years of age.  If you are overweight and have a high risk for diabetes, consider being tested at a younger age or more often. PREVENTING INFECTION  Hepatitis B  If you have a higher risk for hepatitis B, you should be screened for this virus. You are considered at high risk for hepatitis B if:  You were born in a country where hepatitis B is common. Ask your health care provider which countries are considered high risk.  Your parents were born in a high-risk country, and you have not been immunized against hepatitis B (hepatitis B vaccine).  You have HIV or AIDS.  You use needles to inject street drugs.  You live with someone who has hepatitis B.  You have had sex with someone who has hepatitis B.  You get hemodialysis treatment.  You take certain medicines for conditions, including cancer, organ transplantation, and autoimmune conditions. Hepatitis C  Blood testing is recommended for:  Everyone born from [birth year] through 1965.  Anyone with known risk factors for hepatitis C. Sexually transmitted infections (STIs)  You should be screened for sexually transmitted infections (STIs) including gonorrhea and chlamydia if:  You are sexually active and are younger than 34 years of age.  You are older than 34 years of age and your health care provider tells you that you are at risk for this type of infection.  Your sexual activity has changed since you were last screened and you are at an increased risk for chlamydia or gonorrhea. Ask your health care provider if you are at risk.  If you do not have HIV, but are at risk, it may be recommended that you take a prescription medicine daily to prevent HIV infection. This is called pre-exposure prophylaxis (PrEP). You are considered at risk if:  You are sexually active and do not regularly use condoms or know the HIV status of your partner(s).  You take drugs by injection.  You are sexually  active with a partner who has HIV. Talk with your health care provider about whether you are at high risk of being infected with HIV. If you choose to begin PrEP, you should first be tested for HIV. You should then be tested every 3 months for as long as you are taking PrEP.  PREGNANCY   If you are premenopausal and you may become pregnant, ask your health care provider about preconception counseling.  If you may  become pregnant, take 400 to 800 micrograms (mcg) of folic acid every day.  If you want to prevent pregnancy, talk to your health care provider about birth control (contraception). OSTEOPOROSIS AND MENOPAUSE   Osteoporosis is a disease in which the bones lose minerals and strength with aging. This can result in serious bone fractures. Your risk for osteoporosis can be identified using a bone density scan.  If you are 61 years of age or older, or if you are at risk for osteoporosis and fractures, ask your health care provider if you should be screened.  Ask your health care provider whether you should take a calcium or vitamin D supplement to lower your risk for osteoporosis.  Menopause may have certain physical symptoms and risks.  Hormone replacement therapy may reduce some of these symptoms and risks. Talk to your health care provider about whether hormone replacement therapy is right for you.  HOME CARE INSTRUCTIONS   Schedule regular health, dental, and eye exams.  Stay current with your immunizations.   Do not use any tobacco products including cigarettes, chewing tobacco, or electronic cigarettes.  If you are pregnant, do not drink alcohol.  If you are breastfeeding, limit how much and how often you drink alcohol.  Limit alcohol intake to no more than 1 drink per day for nonpregnant women. One drink equals 12 ounces of beer, 5 ounces of wine, or 1 ounces of hard liquor.  Do not use street drugs.  Do not share needles.  Ask your health care provider for help if  you need support or information about quitting drugs.  Tell your health care provider if you often feel depressed.  Tell your health care provider if you have ever been abused or do not feel safe at home.   This information is not intended to replace advice given to you by your health care provider. Make sure you discuss any questions you have with your health care provider.   Document Released: 03/26/2011 Document Revised: 10/01/2014 Document Reviewed: 08/12/2013 Elsevier Interactive Patient Education [employer].

## 2015-07-11 ENCOUNTER — Encounter: Payer: Self-pay | Admitting: Internal Medicine

## 2015-08-02 ENCOUNTER — Encounter: Payer: Self-pay | Admitting: Family Medicine

## 2015-08-02 ENCOUNTER — Ambulatory Visit (INDEPENDENT_AMBULATORY_CARE_PROVIDER_SITE_OTHER): Payer: BLUE CROSS/BLUE SHIELD | Admitting: Family Medicine

## 2015-08-02 VITALS — BP 110/82 | HR 92 | Temp 98.9°F | Ht 69.0 in | Wt 186.8 lb

## 2015-08-02 DIAGNOSIS — R202 Paresthesia of skin: Secondary | ICD-10-CM | POA: Diagnosis not present

## 2015-08-02 DIAGNOSIS — R200 Anesthesia of skin: Secondary | ICD-10-CM

## 2015-08-02 NOTE — Assessment & Plan Note (Signed)
No red flags for CVA etc. No indication for brain imaging at this time. Nml TSH.  Does drink excessive alcohol.. Decrease ETOH.  Stop smoking. May be related to anxiety..  Start daily B12 vitamin.  ? Compression neuropathy given new YOGA. No sign of CTS or ulnar neuropahty. Follow up in 1-2 months if not improving.

## 2015-08-02 NOTE — Progress Notes (Signed)
Subjective:    Patient ID: Julie Jimenez, female    DOB: July 25, 1981, 34 y.o.   MRN: [id number]  HPI 34 year old female pt of [place] with history of  ADHD, generalized anxiety currently on no medication presents with new onset tingling/numbness in left  hand and left leg in last 3 days. Symptoms are off and on, lasts few few hours a t a time.  No clear trigger but may be when she sits still.  No pain leg, nml strength in left leg and hand.  No low back pain, no  Pain in neck.   She reports she is a little bit of a hypochondriac. She is under stress lately.  She has cut back smoking.  She has started YOGA recently.  She had  Has one to two beer a night. Occ drinks 3-4 on weekends. 14 drinks a week. She does take B12 off and on.  TSH 2.471  Social History /Family History/Past Medical History reviewed and updated if needed.  Review of Systems  Constitutional: Negative for fever and fatigue.  HENT: Negative for ear pain.   Eyes: Negative for pain.  Respiratory: Negative for shortness of breath.   Cardiovascular: Negative for chest pain.       Objective:   Physical Exam  Constitutional: She is oriented to person, place, and time. Vital signs are normal. She appears well-developed and well-nourished. She is cooperative.  Non-toxic appearance. She does not appear ill. No distress.  HENT:  Head: Normocephalic.  Right Ear: Hearing, tympanic membrane, external ear and ear canal normal. Tympanic membrane is not erythematous, not retracted and not bulging.  Left Ear: Hearing, tympanic membrane, external ear and ear canal normal. Tympanic membrane is not erythematous, not retracted and not bulging.  Nose: No mucosal edema or rhinorrhea. Right sinus exhibits no maxillary sinus tenderness and no frontal sinus tenderness. Left sinus exhibits no maxillary sinus tenderness and no frontal sinus tenderness.  Mouth/Throat: Uvula is midline, oropharynx is clear and moist and mucous membranes  are normal.  Eyes: Conjunctivae, EOM and lids are normal. Pupils are equal, round, and reactive to light. Lids are everted and swept, no foreign bodies found.  Neck: Trachea normal and normal range of motion. Neck supple. Carotid bruit is not present. No thyroid mass and no thyromegaly present.  Cardiovascular: Normal rate, regular rhythm, S1 normal, S2 normal, normal heart sounds, intact distal pulses and normal pulses.  Exam reveals no gallop and no friction rub.   No murmur heard. Pulses:      Radial pulses are 2+ on the right side, and 2+ on the left side.       Dorsalis pedis pulses are 2+ on the right side, and 2+ on the left side.       Posterior tibial pulses are 2+ on the right side, and 2+ on the left side.  Pulmonary/Chest: Effort normal and breath sounds normal. No tachypnea. No respiratory distress. She has no decreased breath sounds. She has no wheezes. She has no rhonchi. She has no rales.  Abdominal: Soft. Normal appearance and bowel sounds are normal. There is no tenderness.  Musculoskeletal:       Cervical back: She exhibits normal range of motion, no tenderness and no bony tenderness.       Lumbar back: She exhibits normal range of motion, no tenderness, no bony tenderness and no swelling.       Arms: Area of tenderness chronically where she had shingels.  Neurological:  She is alert and oriented to person, place, and time. She has normal strength. No cranial nerve deficit or sensory deficit. She exhibits normal muscle tone. She displays a negative Romberg sign. Coordination and gait normal.  Neg spurling bilaterally  Skin: Skin is warm, dry and intact. No rash noted.  Psychiatric: Her speech is normal and behavior is normal. Judgment and thought content normal. Her mood appears not anxious. Cognition and memory are normal. She does not exhibit a depressed mood.          Assessment & Plan:

## 2015-08-02 NOTE — Progress Notes (Signed)
Pre visit review using our clinic review tool, if applicable. No additional management support is needed unless otherwise documented below in the visit note. 

## 2015-08-02 NOTE — Patient Instructions (Addendum)
Decrease alcohol. Stop smoking. Can try to take B12  1000 mg daily. Decrease stress as able. Keep working on Yoga.

## 2015-08-04 ENCOUNTER — Ambulatory Visit: Payer: BLUE CROSS/BLUE SHIELD | Admitting: Internal Medicine

## 2016-02-24 ENCOUNTER — Encounter: Payer: Self-pay | Admitting: Primary Care

## 2016-02-24 ENCOUNTER — Ambulatory Visit (INDEPENDENT_AMBULATORY_CARE_PROVIDER_SITE_OTHER): Payer: BLUE CROSS/BLUE SHIELD | Admitting: Primary Care

## 2016-02-24 ENCOUNTER — Ambulatory Visit: Payer: BLUE CROSS/BLUE SHIELD | Admitting: Family Medicine

## 2016-02-24 VITALS — BP 114/80 | HR 76 | Temp 97.6°F | Ht 69.0 in | Wt 187.8 lb

## 2016-02-24 DIAGNOSIS — B029 Zoster without complications: Secondary | ICD-10-CM

## 2016-02-24 MED ORDER — VALACYCLOVIR HCL 1 G PO TABS: 1000.0000 mg | ORAL_TABLET | Freq: Three times a day (TID) | ORAL | Status: DC

## 2016-02-24 NOTE — Progress Notes (Signed)
Pre visit review using our clinic review tool, if applicable. No additional management support is needed unless otherwise documented below in the visit note. 

## 2016-02-24 NOTE — Progress Notes (Signed)
   Subjective:    Patient ID: Julie Jimenez, female    DOB: 1981-01-29, 35 y.o.   MRN: [id number]  HPI  Julie Jimenez is a 35 year old female who presents today with a chief complaint of tingling and pain. She has a history of shingles that 4 years ago, and her symptoms today feel the same.   Her symptoms have been present for 6 days and are located to the right side of her lower back. She experiences discomfort during showering, brushing a feather against her skin, being touched by anything. She's been under increased stress and had a fever blister earlier this week. She denies rash, fevers, chills.   Review of Systems  Constitutional: Negative for fever and chills.  Skin: Negative for rash.  Neurological:       Tingling and pain to skin of right lower back.       Past Medical History  Diagnosis Date  . History of shingles 2013     Social History   Social History  . Marital Status: Single    Spouse Name: N/A  . Number of Children: N/A  . Years of Education: N/A   Occupational History  . Not on file.   Social History Main Topics  . Smoking status: Current Some Day Smoker -- 0.50 packs/day for 10 years    Types: Cigarettes  . Smokeless tobacco: Never Used  . Alcohol Use: 0.0 oz/week    0 Standard drinks or equivalent per week     Comment: moderate  . Drug Use: No  . Sexual Activity: Not on file   Other Topics Concern  . Not on file   Social History Narrative    Past Surgical History  Procedure Laterality Date  . Tonsillectomy and adenoidectomy      Family History  Problem Relation Age of Onset  . Heart disease Paternal Grandmother   . Heart disease Paternal Grandfather     Allergies  Allergen Reactions  . Augmentin [Amoxicillin-Pot Clavulanate] Rash  . Codeine Nausea And Vomiting and Rash    No current outpatient prescriptions on file prior to visit.   No current facility-administered medications on file prior to visit.    BP 114/80 mmHg  Pulse 76   Temp(Src) 97.6 F (36.4 C) (Oral)  Ht 5\' 9"  (1.753 m)  Wt 187 lb 12.8 oz (85.186 kg)  BMI 27.72 kg/m2  SpO2 99%  LMP 02/11/2016    Objective:   Physical Exam  Constitutional: She appears well-nourished.  Cardiovascular: Normal rate and regular rhythm.   Pulmonary/Chest: Effort normal and breath sounds normal.  Skin: Skin is warm and dry. No rash noted. No erythema.  Moderately tender to light palpation. Pain located in specific dermatome pattern.          Assessment & Plan:  Pain and Tingling:  Present to right lower back x 6 days. Increased stress in personal life, fever blisters on exam today. Moderate pain to very light palpation in area of concern. Given history, symptoms, increased stress will treat for potential outbreak. Rx for Valtrex 7 day course sent to pharmacy. Return precautions provided.

## 2016-02-24 NOTE — Patient Instructions (Addendum)
Start Valtrex tablets for presumed shingles outbreak. Take 1 tablet by mouth three times daily for 7 days.  Please notify me if no improvement in symptoms after completion of medication.  It was a pleasure meeting you!  Shingles Shingles, which is also known as herpes zoster, is an infection that causes a painful skin rash and fluid-filled blisters. Shingles is not related to genital herpes, which is a sexually transmitted infection.   Shingles only develops in people who:  Have had chickenpox.  Have received the chickenpox vaccine. (This is rare.) CAUSES Shingles is caused by varicella-zoster virus (VZV). This is the same virus that causes chickenpox. After exposure to VZV, the virus stays in the body in an inactive (dormant) state. Shingles develops if the virus reactivates. This can happen many years after the initial exposure to VZV. It is not known what causes this virus to reactivate. RISK FACTORS People who have had chickenpox or received the chickenpox vaccine are at risk for shingles. Infection is more common in people who:  Are older than age 71.  Have a weakened defense (immune) system, such as those with HIV, AIDS, or cancer.  Are taking medicines that weaken the immune system, such as transplant medicines.  Are under great stress. SYMPTOMS Early symptoms of this condition include itching, tingling, and pain in an area on your skin. Pain may be described as burning, stabbing, or throbbing. A few days or weeks after symptoms start, a painful red rash appears, usually on one side of the body in a bandlike or beltlike pattern. The rash eventually turns into fluid-filled blisters that break open, scab over, and dry up in about 2-3 weeks. At any time during the infection, you may also develop:  A fever.  Chills.  A headache.  An upset stomach. DIAGNOSIS This condition is diagnosed with a skin exam. Sometimes, skin or fluid samples are taken from the blisters before a  diagnosis is made. These samples are examined under a microscope or sent to a lab for testing. TREATMENT There is no specific cure for this condition. Your health care provider will probably prescribe medicines to help you manage pain, recover more quickly, and avoid long-term problems. Medicines may include:  Antiviral drugs.  Anti-inflammatory drugs.  Pain medicines. If the area involved is on your face, you may be referred to a specialist, such as an eye doctor (ophthalmologist) or an ear, nose, and throat (ENT) doctor to help you avoid eye problems, chronic pain, or disability. HOME CARE INSTRUCTIONS Medicines  Take medicines only as directed by your health care provider.  Apply an anti-itch or numbing cream to the affected area as directed by your health care provider. Blister and Rash Care  Take a cool bath or apply cool compresses to the area of the rash or blisters as directed by your health care provider. This may help with pain and itching.  Keep your rash covered with a loose bandage (dressing). Wear loose-fitting clothing to help ease the pain of material rubbing against the rash.  Keep your rash and blisters clean with mild soap and cool water or as directed by your health care provider.  Check your rash every day for signs of infection. These include redness, swelling, and pain that lasts or increases.  Do not pick your blisters.  Do not scratch your rash. General Instructions  Rest as directed by your health care provider.  Keep all follow-up visits as directed by your health care provider. This is important.  Until  your blisters scab over, your infection can cause chickenpox in people who have never had it or been vaccinated against it. To prevent this from happening, avoid contact with other people, especially:  Babies.  Pregnant women.  Children who have eczema.  Elderly people who have transplants.  People who have chronic illnesses, such as leukemia or  AIDS. SEEK MEDICAL CARE IF:  Your pain is not relieved with prescribed medicines.  Your pain does not get better after the rash heals.  Your rash looks infected. Signs of infection include redness, swelling, and pain that lasts or increases. SEEK IMMEDIATE MEDICAL CARE IF:  The rash is on your face or nose.  You have facial pain, pain around your eye area, or loss of feeling on one side of your face.  You have ear pain or you have ringing in your ear.  You have loss of taste.  Your condition gets worse.   This information is not intended to replace advice given to you by your health care provider. Make sure you discuss any questions you have with your health care provider.   Document Released: 09/10/2005 Document Revised: 10/01/2014 Document Reviewed: 07/22/2014 Elsevier Interactive Patient Education [employer]2016 [employer]

## 2016-02-27 ENCOUNTER — Telehealth: Payer: Self-pay

## 2016-02-27 DIAGNOSIS — M792 Neuralgia and neuritis, unspecified: Secondary | ICD-10-CM

## 2016-02-27 MED ORDER — GABAPENTIN 100 MG PO CAPS: 100.0000 mg | ORAL_CAPSULE | Freq: Two times a day (BID) | ORAL | Status: DC

## 2016-02-27 NOTE — Telephone Encounter (Signed)
If no improvement then it was not likely shingles. She could have some nerve damage from prior shingles involvement. I would like for her to try Gabapentin twice daily for then next several weeks and notify us if no improvement. This medication may cause drowsiness. I'm also starting her at a lower dose, so we can always increase.

## 2016-02-27 NOTE — Telephone Encounter (Signed)
Pt left v/m; pt was seen on 02/24/16 with shingles; pt cannot see any improvement with shingles after taking med; appears the same as when seen on 02/24/16. Pt request cb. [facility].

## 2016-02-28 NOTE — Telephone Encounter (Signed)
Spoken and notified patient of Kate's comments. Patient wants to finish the valacyclovir (VALTREX) then will start the Gabapentin if still have pain. Patient wants know if it is ok to drink wine with these medications. Notify patient that it is not a good idea to so. Patient verbalized understanding.

## 2016-07-12 ENCOUNTER — Ambulatory Visit (INDEPENDENT_AMBULATORY_CARE_PROVIDER_SITE_OTHER): Payer: BLUE CROSS/BLUE SHIELD | Admitting: Internal Medicine

## 2016-07-12 ENCOUNTER — Encounter: Payer: Self-pay | Admitting: Internal Medicine

## 2016-07-12 VITALS — BP 122/80 | HR 88 | Temp 98.6°F | Ht 68.75 in | Wt 179.8 lb

## 2016-07-12 DIAGNOSIS — Z0000 Encounter for general adult medical examination without abnormal findings: Secondary | ICD-10-CM | POA: Diagnosis not present

## 2016-07-12 DIAGNOSIS — Z23 Encounter for immunization: Secondary | ICD-10-CM | POA: Diagnosis not present

## 2016-07-12 DIAGNOSIS — Z114 Encounter for screening for human immunodeficiency virus [HIV]: Secondary | ICD-10-CM | POA: Diagnosis not present

## 2016-07-12 NOTE — Progress Notes (Signed)
Subjective:    Patient ID: Julie Jimenez, female    DOB: [birth year], 35 y.o.   MRN: [id number]  HPI  Pt presents to the clinic today for her annual exam.  Flu: 2014 Tetanus: > 10 years ago Pap Smear: 02/2013 Dentist: biannually  Diet: She tries to avoid dairy and sugar. She tries to avoid fried foods. She eats lots of organic fruits and veggies. She drinks mostly water and beer. Exercise: She is doing [employer] 3 days a week for 1 hour.  Review of Systems   Past Medical History:  Diagnosis Date  . History of shingles 2013    Current Outpatient Prescriptions  Medication Sig Dispense Refill  . gabapentin (NEURONTIN) 100 MG capsule Take 1 capsule (100 mg total) by mouth 2 (two) times daily. 60 capsule 0  . valACYclovir (VALTREX) 1000 MG tablet Take 1 tablet (1,000 mg total) by mouth 3 (three) times daily. 21 tablet 0   No current facility-administered medications for this visit.     Allergies  Allergen Reactions  . Augmentin [Amoxicillin-Pot Clavulanate] Rash  . Codeine Nausea And Vomiting and Rash    Family History  Problem Relation Age of Onset  . Heart disease Paternal Grandmother   . Heart disease Paternal Grandfather     Social History   Social History  . Marital status: Single    Spouse name: N/A  . Number of children: N/A  . Years of education: N/A   Occupational History  . Not on file.   Social History Main Topics  . Smoking status: Current Some Day Smoker    Packs/day: 0.50    Years: 10.00    Types: Cigarettes  . Smokeless tobacco: Never Used  . Alcohol use 0.0 oz/week     Comment: moderate  . Drug use: No  . Sexual activity: Not on file   Other Topics Concern  . Not on file   Social History Narrative  . No narrative on file     Constitutional: Denies fever, malaise, fatigue, headache or abrupt weight changes.  HEENT: Denies eye pain, eye redness, ear pain, ringing in the ears, wax buildup, runny nose, nasal congestion, bloody nose, or  sore throat. Respiratory: Denies difficulty breathing, shortness of breath, cough or sputum production.   Cardiovascular: Denies chest pain, chest tightness, palpitations or swelling in the hands or feet.  Gastrointestinal: Denies abdominal pain, bloating, constipation, diarrhea or blood in the stool.  GU: Denies urgency, frequency, pain with urination, burning sensation, blood in urine, odor or discharge. Musculoskeletal: Denies decrease in range of motion, difficulty with gait, muscle pain or joint pain and swelling.  Skin: Denies redness, rashes, lesions or ulcercations.  Neurological: Denies dizziness, difficulty with memory, difficulty with speech or problems with balance and coordination.  Psych: Pt reports anxiety. Denies depression, SI/HI.  No other specific complaints in a complete review of systems (except as listed in HPI above).     Objective:   Physical Exam  BP 122/80   Pulse 88   Temp 98.6 F (37 C) (Oral)   Ht 5' 8.75" (1.746 m)   Wt 179 lb 12 oz (81.5 kg)   LMP 07/09/2016   SpO2 99%   BMI 26.74 kg/m    Wt Readings from Last 3 Encounters:  02/24/16 187 lb 12.8 oz (85.2 kg)  08/02/15 186 lb 12 oz (84.7 kg)  07/08/15 188 lb (85.3 kg)    General: Appears her stated age, well developed, well nourished in NAD. Skin: Warm,  dry and intact.  HEENT: Head: normal shape and size; Eyes: sclera white, no icterus, conjunctiva pink, PERRLA and EOMs intact; Ears: Tm's gray and intact, normal light reflex;  Throat/Mouth: Teeth present, mucosa pink and moist, no exudate, lesions or ulcerations noted.  Neck:  Neck supple, trachea midline. No masses, lumps or thyromegaly present.  Cardiovascular: Normal rate and rhythm. S1,S2 noted.  No murmur, rubs or gallops noted. No JVD or BLE edema.  Pulmonary/Chest: Normal effort and positive vesicular breath sounds. No respiratory distress. No wheezes, rales or ronchi noted.  Abdomen: Soft and nontender. Normal bowel sounds. No distention  or masses noted. Liver, spleen and kidneys non palpable. Musculoskeletal: Strength 5/5 BUE/BLE. No signs of joint swelling. No difficulty with gait.  Neurological: Alert and oriented. Cranial nerves II-XII grossly intact. Coordination normal.  Psychiatric: She is anxious today.   BMET    Component Value Date/Time   NA 139 07/08/2015 1505   NA 141 05/27/2014 0122   K 4.3 07/08/2015 1505   K 3.5 05/27/2014 0122   CL 102 07/08/2015 1505   CL 107 05/27/2014 0122   CO2 21 07/08/2015 1505   CO2 24 05/27/2014 0122   GLUCOSE 78 07/08/2015 1505   GLUCOSE 96 05/27/2014 0122   BUN 5 (L) 07/08/2015 1505   BUN 9 05/27/2014 0122   CREATININE 0.70 07/08/2015 1505   CALCIUM 9.3 07/08/2015 1505   CALCIUM 8.9 05/27/2014 0122   GFRNONAA >60 05/27/2014 0122   GFRAA >60 05/27/2014 0122    Lipid Panel     Component Value Date/Time   CHOL 174 07/08/2015 1505   TRIG 50 07/08/2015 1505   HDL 45 (L) 07/08/2015 1505   CHOLHDL 3.9 07/08/2015 1505   VLDL 10 07/08/2015 1505   LDLCALC 119 07/08/2015 1505    CBC    Component Value Date/Time   WBC 12.1 (H) 07/08/2015 1505   RBC 5.18 (H) 07/08/2015 1505   HGB 15.9 (H) 07/08/2015 1505   HGB 16.5 (H) 05/27/2014 0122   HCT 46.7 (H) 07/08/2015 1505   HCT 51.0 (H) 05/27/2014 0122   PLT 348 07/08/2015 1505   PLT 352 05/27/2014 0122   MCV 90.2 07/08/2015 1505   MCV 96 05/27/2014 0122   MCH 30.7 07/08/2015 1505   MCHC 34.0 07/08/2015 1505   RDW 13.3 07/08/2015 1505   RDW 12.6 05/27/2014 0122   LYMPHSABS 3.8 (H) 05/27/2014 0122   MONOABS 0.7 05/27/2014 0122   EOSABS 0.3 05/27/2014 0122   BASOSABS 0.1 05/27/2014 0122    Hgb A1C Lab Results  Component Value Date   HGBA1C 5.1 07/08/2015          Assessment & Plan:   Preventative Health Maintenance:  Flu and Tdap today She will call her gyn to schedule her pap, due this year Encourage her to consume a balanced diet and start an exercise regimen Encouraged her to see an dentist at  least bianually CBC, CMET, Lipid, HIV today  RTC in 1 year or sooner if needed Nicki ReaperBAITY, Deisy Ozbun, NP

## 2016-07-12 NOTE — Patient Instructions (Signed)
Health Maintenance, Female Adopting a healthy lifestyle and getting preventive care can go a long way to promote health and wellness. Talk with your health care provider about what schedule of regular examinations is right for you. This is a good chance for you to check in with your provider about disease prevention and staying healthy. In between checkups, there are plenty of things you can do on your own. Experts have done a lot of research about which lifestyle changes and preventive measures are most likely to keep you healthy. Ask your health care provider for more information. WEIGHT AND DIET  Eat a healthy diet  Be sure to include plenty of vegetables, fruits, low-fat dairy products, and lean protein.  Do not eat a lot of foods high in solid fats, added sugars, or salt.  Get regular exercise. This is one of the most important things you can do for your health.  Most adults should exercise for at least 150 minutes each week. The exercise should increase your heart rate and make you sweat (moderate-intensity exercise).  Most adults should also do strengthening exercises at least twice a week. This is in addition to the moderate-intensity exercise.  Maintain a healthy weight  Body mass index (BMI) is a measurement that can be used to identify possible weight problems. It estimates body fat based on height and weight. Your health care provider can help determine your BMI and help you achieve or maintain a healthy weight.  For females 20 years of age and older:   A BMI below 18.5 is considered underweight.  A BMI of 18.5 to 24.9 is normal.  A BMI of 25 to 29.9 is considered overweight.  A BMI of 30 and above is considered obese.  Watch levels of cholesterol and blood lipids  You should start having your blood tested for lipids and cholesterol at 35 years of age, then have this test every 5 years.  You may need to have your cholesterol levels checked more often if:  Your lipid  or cholesterol levels are high.  You are older than 35 years of age.  You are at high risk for heart disease.  CANCER SCREENING   Lung Cancer  Lung cancer screening is recommended for adults 55-80 years old who are at high risk for lung cancer because of a history of smoking.  A yearly low-dose CT scan of the lungs is recommended for people who:  Currently smoke.  Have quit within the past 15 years.  Have at least a 30-pack-year history of smoking. A pack year is smoking an average of one pack of cigarettes a day for 1 year.  Yearly screening should continue until it has been 15 years since you quit.  Yearly screening should stop if you develop a health problem that would prevent you from having lung cancer treatment.  Breast Cancer  Practice breast self-awareness. This means understanding how your breasts normally appear and feel.  It also means doing regular breast self-exams. Let your health care provider know about any changes, no matter how small.  If you are in your 20s or 30s, you should have a clinical breast exam (CBE) by a health care provider every 1-3 years as part of a regular health exam.  If you are 40 or older, have a CBE every year. Also consider having a breast X-ray (mammogram) every year.  If you have a family history of breast cancer, talk to your health care provider about genetic screening.  If you   are at high risk for breast cancer, talk to your health care provider about having an MRI and a mammogram every year.  Breast cancer gene (BRCA) assessment is recommended for women who have family members with BRCA-related cancers. BRCA-related cancers include:  Breast.  Ovarian.  Tubal.  Peritoneal cancers.  Results of the assessment will determine the need for genetic counseling and BRCA1 and BRCA2 testing. Cervical Cancer Your health care provider may recommend that you be screened regularly for cancer of the pelvic organs (ovaries, uterus, and  vagina). This screening involves a pelvic examination, including checking for microscopic changes to the surface of your cervix (Pap test). You may be encouraged to have this screening done every 3 years, beginning at age 21.  For women ages 30-65, health care providers may recommend pelvic exams and Pap testing every 3 years, or they may recommend the Pap and pelvic exam, combined with testing for human papilloma virus (HPV), every 5 years. Some types of HPV increase your risk of cervical cancer. Testing for HPV may also be done on women of any age with unclear Pap test results.  Other health care providers may not recommend any screening for nonpregnant women who are considered low risk for pelvic cancer and who do not have symptoms. Ask your health care provider if a screening pelvic exam is right for you.  If you have had past treatment for cervical cancer or a condition that could lead to cancer, you need Pap tests and screening for cancer for at least 20 years after your treatment. If Pap tests have been discontinued, your risk factors (such as having a new sexual partner) need to be reassessed to determine if screening should resume. Some women have medical problems that increase the chance of getting cervical cancer. In these cases, your health care provider may recommend more frequent screening and Pap tests. Colorectal Cancer  This type of cancer can be detected and often prevented.  Routine colorectal cancer screening usually begins at 35 years of age and continues through 35 years of age.  Your health care provider may recommend screening at an earlier age if you have risk factors for colon cancer.  Your health care provider may also recommend using home test kits to check for hidden blood in the stool.  A small camera at the end of a tube can be used to examine your colon directly (sigmoidoscopy or colonoscopy). This is done to check for the earliest forms of colorectal  cancer.  Routine screening usually begins at age 50.  Direct examination of the colon should be repeated every 5-10 years through 35 years of age. However, you may need to be screened more often if early forms of precancerous polyps or small growths are found. Skin Cancer  Check your skin from head to toe regularly.  Tell your health care provider about any new moles or changes in moles, especially if there is a change in a mole's shape or color.  Also tell your health care provider if you have a mole that is larger than the size of a pencil eraser.  Always use sunscreen. Apply sunscreen liberally and repeatedly throughout the day.  Protect yourself by wearing long sleeves, pants, a wide-brimmed hat, and sunglasses whenever you are outside. HEART DISEASE, DIABETES, AND HIGH BLOOD PRESSURE   High blood pressure causes heart disease and increases the risk of stroke. High blood pressure is more likely to develop in:  People who have blood pressure in the high end   of the normal range (130-139/85-89 mm Hg).  People who are overweight or obese.  People who are African American.  If you are 38-23 years of age, have your blood pressure checked every 3-5 years. If you are 61 years of age or older, have your blood pressure checked every year. You should have your blood pressure measured twice--once when you are at a hospital or clinic, and once when you are not at a hospital or clinic. Record the average of the two measurements. To check your blood pressure when you are not at a hospital or clinic, you can use:  An automated blood pressure machine at a pharmacy.  A home blood pressure monitor.  If you are between 45 years and 39 years old, ask your health care provider if you should take aspirin to prevent strokes.  Have regular diabetes screenings. This involves taking a blood sample to check your fasting blood sugar level.  If you are at a normal weight and have a low risk for diabetes,  have this test once every three years after 35 years of age.  If you are overweight and have a high risk for diabetes, consider being tested at a younger age or more often. PREVENTING INFECTION  Hepatitis B  If you have a higher risk for hepatitis B, you should be screened for this virus. You are considered at high risk for hepatitis B if:  You were born in a country where hepatitis B is common. Ask your health care provider which countries are considered high risk.  Your parents were born in a high-risk country, and you have not been immunized against hepatitis B (hepatitis B vaccine).  You have HIV or AIDS.  You use needles to inject street drugs.  You live with someone who has hepatitis B.  You have had sex with someone who has hepatitis B.  You get hemodialysis treatment.  You take certain medicines for conditions, including cancer, organ transplantation, and autoimmune conditions. Hepatitis C  Blood testing is recommended for:  Everyone born from [birth year] through 1965.  Anyone with known risk factors for hepatitis C. Sexually transmitted infections (STIs)  You should be screened for sexually transmitted infections (STIs) including gonorrhea and chlamydia if:  You are sexually active and are younger than 35 years of age.  You are older than 35 years of age and your health care provider tells you that you are at risk for this type of infection.  Your sexual activity has changed since you were last screened and you are at an increased risk for chlamydia or gonorrhea. Ask your health care provider if you are at risk.  If you do not have HIV, but are at risk, it may be recommended that you take a prescription medicine daily to prevent HIV infection. This is called pre-exposure prophylaxis (PrEP). You are considered at risk if:  You are sexually active and do not regularly use condoms or know the HIV status of your partner(s).  You take drugs by injection.  You are sexually  active with a partner who has HIV. Talk with your health care provider about whether you are at high risk of being infected with HIV. If you choose to begin PrEP, you should first be tested for HIV. You should then be tested every 3 months for as long as you are taking PrEP.  PREGNANCY   If you are premenopausal and you may become pregnant, ask your health care provider about preconception counseling.  If you may  become pregnant, take 400 to 800 micrograms (mcg) of folic acid every day.  If you want to prevent pregnancy, talk to your health care provider about birth control (contraception). OSTEOPOROSIS AND MENOPAUSE   Osteoporosis is a disease in which the bones lose minerals and strength with aging. This can result in serious bone fractures. Your risk for osteoporosis can be identified using a bone density scan.  If you are 61 years of age or older, or if you are at risk for osteoporosis and fractures, ask your health care provider if you should be screened.  Ask your health care provider whether you should take a calcium or vitamin D supplement to lower your risk for osteoporosis.  Menopause may have certain physical symptoms and risks.  Hormone replacement therapy may reduce some of these symptoms and risks. Talk to your health care provider about whether hormone replacement therapy is right for you.  HOME CARE INSTRUCTIONS   Schedule regular health, dental, and eye exams.  Stay current with your immunizations.   Do not use any tobacco products including cigarettes, chewing tobacco, or electronic cigarettes.  If you are pregnant, do not drink alcohol.  If you are breastfeeding, limit how much and how often you drink alcohol.  Limit alcohol intake to no more than 1 drink per day for nonpregnant women. One drink equals 12 ounces of beer, 5 ounces of wine, or 1 ounces of hard liquor.  Do not use street drugs.  Do not share needles.  Ask your health care provider for help if  you need support or information about quitting drugs.  Tell your health care provider if you often feel depressed.  Tell your health care provider if you have ever been abused or do not feel safe at home.   This information is not intended to replace advice given to you by your health care provider. Make sure you discuss any questions you have with your health care provider.   Document Released: 03/26/2011 Document Revised: 10/01/2014 Document Reviewed: 08/12/2013 Elsevier Interactive Patient Education [employer].

## 2016-07-13 LAB — COMPREHENSIVE METABOLIC PANEL
ALBUMIN: 4.6 g/dL (ref 3.5–5.2)
ALK PHOS: 49 U/L (ref 39–117)
ALT: 16 U/L (ref 0–35)
AST: 23 U/L (ref 0–37)
BILIRUBIN TOTAL: 1 mg/dL (ref 0.2–1.2)
BUN: 10 mg/dL (ref 6–23)
CO2: 27 mEq/L (ref 19–32)
CREATININE: 0.84 mg/dL (ref 0.40–1.20)
Calcium: 9.6 mg/dL (ref 8.4–10.5)
Chloride: 104 mEq/L (ref 96–112)
GFR: 81.85 mL/min (ref >60.00)
GLUCOSE: 79 mg/dL (ref 70–99)
POTASSIUM: 4 meq/L (ref 3.5–5.1)
SODIUM: 139 meq/L (ref 135–145)
Total Protein: 6.8 g/dL (ref 6.0–8.3)

## 2016-07-13 LAB — LIPID PANEL
CHOLESTEROL: 177 mg/dL (ref 0–200)
HDL: 51.3 mg/dL (ref >39.00)
LDL Cholesterol: 113 mg/dL — ABNORMAL HIGH (ref 0–99)
NonHDL: 126.01
TRIGLYCERIDES: 65 mg/dL (ref 0.0–149.0)
Total CHOL/HDL Ratio: 3
VLDL: 13 mg/dL (ref 0.0–40.0)

## 2016-07-13 LAB — CBC
HCT: 46.1 % — ABNORMAL HIGH (ref 36.0–46.0)
Hemoglobin: 15.6 g/dL — ABNORMAL HIGH (ref 12.0–15.0)
MCHC: 33.7 g/dL (ref 30.0–36.0)
MCV: 93.4 fl (ref 78.0–100.0)
Platelets: 354 10*3/uL (ref 150.0–400.0)
RBC: 4.94 Mil/uL (ref 3.87–5.11)
RDW: 13.1 % (ref 11.5–15.5)
WBC: 10.2 10*3/uL (ref 4.0–10.5)

## 2016-07-13 LAB — HIV ANTIBODY (ROUTINE TESTING W REFLEX): HIV: NONREACTIVE

## 2016-07-16 NOTE — Addendum Note (Signed)
Addended by: Roena MaladyEVONTENNO, Taiyo Kozma Y on: 07/16/2016 09:04 AM   Modules accepted: Orders

## 2016-11-19 ENCOUNTER — Ambulatory Visit: Payer: BLUE CROSS/BLUE SHIELD | Admitting: Internal Medicine

## 2017-08-01 ENCOUNTER — Ambulatory Visit (INDEPENDENT_AMBULATORY_CARE_PROVIDER_SITE_OTHER): Payer: BLUE CROSS/BLUE SHIELD | Admitting: Internal Medicine

## 2017-08-01 ENCOUNTER — Encounter: Payer: Self-pay | Admitting: Internal Medicine

## 2017-08-01 VITALS — BP 124/82 | HR 88 | Temp 98.6°F | Ht 68.75 in | Wt 177.0 lb

## 2017-08-01 DIAGNOSIS — F411 Generalized anxiety disorder: Secondary | ICD-10-CM | POA: Diagnosis not present

## 2017-08-01 DIAGNOSIS — Z23 Encounter for immunization: Secondary | ICD-10-CM

## 2017-08-01 DIAGNOSIS — Z0001 Encounter for general adult medical examination with abnormal findings: Secondary | ICD-10-CM

## 2017-08-01 DIAGNOSIS — Z13220 Encounter for screening for lipoid disorders: Secondary | ICD-10-CM | POA: Diagnosis not present

## 2017-08-01 LAB — LIPID PANEL
Cholesterol: 170 mg/dL (ref 0–200)
HDL: 53.8 mg/dL (ref >39.00)
LDL Cholesterol: 108 mg/dL — ABNORMAL HIGH (ref 0–99)
NONHDL: 116.63
Total CHOL/HDL Ratio: 3
Triglycerides: 44 mg/dL (ref 0.0–149.0)
VLDL: 8.8 mg/dL (ref 0.0–40.0)

## 2017-08-01 LAB — COMPREHENSIVE METABOLIC PANEL
ALK PHOS: 48 U/L (ref 39–117)
ALT: 15 U/L (ref 0–35)
AST: 20 U/L (ref 0–37)
Albumin: 4.3 g/dL (ref 3.5–5.2)
BUN: 10 mg/dL (ref 6–23)
CO2: 29 mEq/L (ref 19–32)
CREATININE: 0.75 mg/dL (ref 0.40–1.20)
Calcium: 9.3 mg/dL (ref 8.4–10.5)
Chloride: 103 mEq/L (ref 96–112)
GFR: 92.73 mL/min (ref >60.00)
GLUCOSE: 85 mg/dL (ref 70–99)
POTASSIUM: 3.9 meq/L (ref 3.5–5.1)
Sodium: 137 mEq/L (ref 135–145)
TOTAL PROTEIN: 6.3 g/dL (ref 6.0–8.3)
Total Bilirubin: 1.1 mg/dL (ref 0.2–1.2)

## 2017-08-01 LAB — CBC
HCT: 46 % (ref 36.0–46.0)
HEMOGLOBIN: 15.3 g/dL — AB (ref 12.0–15.0)
MCHC: 33.2 g/dL (ref 30.0–36.0)
MCV: 94.9 fl (ref 78.0–100.0)
Platelets: 352 10*3/uL (ref 150.0–400.0)
RBC: 4.85 Mil/uL (ref 3.87–5.11)
RDW: 12.2 % (ref 11.5–15.5)
WBC: 12.1 10*3/uL — ABNORMAL HIGH (ref 4.0–10.5)

## 2017-08-01 NOTE — Patient Instructions (Signed)

## 2017-08-01 NOTE — Assessment & Plan Note (Signed)
CBC and CMET today No medications at this time, will monitor

## 2017-08-01 NOTE — Progress Notes (Signed)
Subjective:    Patient ID: Julie Jimenez, female    DOB: 1980/12/31, 36 y.o.   MRN: [id number]  HPI  Pt presents to the clinic today for her annual exam.  Flu: 06/2016 Tetanus: 06/2016 Pap Smear: 2014, she has this scheduled with her GYN Dentist: biannually  Diet: She does not eat meat. She consumes fruits and veggies. She tries to avoid fried foods. She drinks mostly water. Exercise: She has been going to the gym for at least an hour, 3 days per week.   Review of Systems  Past Medical History:  Diagnosis Date  . History of shingles 2013    No current outpatient medications on file.   No current facility-administered medications for this visit.     Allergies  Allergen Reactions  . Augmentin [Amoxicillin-Pot Clavulanate] Rash  . Codeine Nausea And Vomiting and Rash    Family History  Problem Relation Age of Onset  . Heart disease Paternal Grandmother   . Heart disease Paternal Grandfather     Social History   Socioeconomic History  . Marital status: Single    Spouse name: Not on file  . Number of children: Not on file  . Years of education: Not on file  . Highest education level: Not on file  Social Needs  . Financial resource strain: Not on file  . Food insecurity - worry: Not on file  . Food insecurity - inability: Not on file  . Transportation needs - medical: Not on file  . Transportation needs - non-medical: Not on file  Occupational History  . Not on file  Tobacco Use  . Smoking status: Current Some Day Smoker    Packs/day: 0.25    Years: 10.00    Pack years: 2.50    Types: Cigarettes  . Smokeless tobacco: Never Used  Substance and Sexual Activity  . Alcohol use: Yes    Alcohol/week: 4.8 oz    Types: 8 Glasses of wine per week    Comment: moderate  . Drug use: No  . Sexual activity: Not on file  Other Topics Concern  . Not on file  Social History Narrative  . Not on file     Constitutional: Denies fever, malaise, fatigue, headache or  abrupt weight changes.  HEENT: Denies eye pain, eye redness, ear pain, ringing in the ears, wax buildup, runny nose, nasal congestion, bloody nose, or sore throat. Respiratory: Denies difficulty breathing, shortness of breath, cough or sputum production.   Cardiovascular: Denies chest pain, chest tightness, palpitations or swelling in the hands or feet.  Gastrointestinal: Denies abdominal pain, bloating, constipation, diarrhea or blood in the stool.  GU: Denies urgency, frequency, pain with urination, burning sensation, blood in urine, odor or discharge. Musculoskeletal: Denies decrease in range of motion, difficulty with gait, muscle pain or joint pain and swelling.  Skin: Denies redness, rashes, lesions or ulcercations.  Neurological: Pt reports intermittent nerve pain to the left of the thoracic spine (history of shingles in that area). Denies dizziness, difficulty with memory, difficulty with speech or problems with balance and coordination.  Psych: Pt reports anxiety. Denies depression, SI/HI.  No other specific complaints in a complete review of systems (except as listed in HPI above).     Objective:   Physical Exam  BP 124/82   Pulse 88   Temp 98.6 F (37 C) (Oral)   Ht 5' 8.75" (1.746 m)   Wt 177 lb (80.3 kg)   LMP 07/18/2017   SpO2 98%  BMI 26.33 kg/m  Wt Readings from Last 3 Encounters:  08/01/17 177 lb (80.3 kg)  07/12/16 179 lb 12 oz (81.5 kg)  02/24/16 187 lb 12.8 oz (85.2 kg)    General: Appears her stated age, well developed, well nourished in NAD. Skin: Warm, dry and intact. She has a large cyst, midline back. HEENT: Head: normal shape and size; Eyes: sclera white, no icterus, conjunctiva pink, PERRLA and EOMs intact; Ears: Tm's gray and intact, normal light reflex;  Throat/Mouth: Teeth present, mucosa pink and moist, no exudate, lesions or ulcerations noted.  Neck:  Neck supple, trachea midline. No masses, lumps or thyromegaly present.  Cardiovascular: Normal  rate and rhythm. S1,S2 noted.  No murmur, rubs or gallops noted. No JVD or BLE edema.  Pulmonary/Chest: Normal effort and positive vesicular breath sounds. No respiratory distress. No wheezes, rales or ronchi noted.  Abdomen: Soft and nontender. Normal bowel sounds. No distention or masses noted. Liver, spleen and kidneys non palpable. Musculoskeletal: Strength 5/5 BUE/BLE. No difficulty with gait.  Neurological: Alert and oriented. Cranial nerves II-XII grossly intact. Coordination normal.  Psychiatric: She is very anxious appearing today.    BMET    Component Value Date/Time   NA 139 07/12/2016 1557   NA 141 05/27/2014 0122   K 4.0 07/12/2016 1557   K 3.5 05/27/2014 0122   CL 104 07/12/2016 1557   CL 107 05/27/2014 0122   CO2 27 07/12/2016 1557   CO2 24 05/27/2014 0122   GLUCOSE 79 07/12/2016 1557   GLUCOSE 96 05/27/2014 0122   BUN 10 07/12/2016 1557   BUN 9 05/27/2014 0122   CREATININE 0.84 07/12/2016 1557   CREATININE 0.70 07/08/2015 1505   CALCIUM 9.6 07/12/2016 1557   CALCIUM 8.9 05/27/2014 0122   GFRNONAA >60 05/27/2014 0122   GFRAA >60 05/27/2014 0122    Lipid Panel     Component Value Date/Time   CHOL 177 07/12/2016 1557   TRIG 65.0 07/12/2016 1557   HDL 51.30 07/12/2016 1557   CHOLHDL 3 07/12/2016 1557   VLDL 13.0 07/12/2016 1557   LDLCALC 113 (H) 07/12/2016 1557    CBC    Component Value Date/Time   WBC 10.2 07/12/2016 1557   RBC 4.94 07/12/2016 1557   HGB 15.6 (H) 07/12/2016 1557   HGB 16.5 (H) 05/27/2014 0122   HCT 46.1 (H) 07/12/2016 1557   HCT 51.0 (H) 05/27/2014 0122   PLT 354.0 07/12/2016 1557   PLT 352 05/27/2014 0122   MCV 93.4 07/12/2016 1557   MCV 96 05/27/2014 0122   MCH 30.7 07/08/2015 1505   MCHC 33.7 07/12/2016 1557   RDW 13.1 07/12/2016 1557   RDW 12.6 05/27/2014 0122   LYMPHSABS 3.8 (H) 05/27/2014 0122   MONOABS 0.7 05/27/2014 0122   EOSABS 0.3 05/27/2014 0122   BASOSABS 0.1 05/27/2014 0122    Hgb A1C Lab Results    Component Value Date   HGBA1C 5.1 07/08/2015            Assessment & Plan:   Preventative Health Maintenance:  Flu shot today Tetanus is UTD She has her pap smear scheduled with GYN Encouraged her to consume a balanced diet and exercise regimen Advised her to see a dentist annually Will check CBC, CMET, Lipid profile today  RTC in 1 year, sooner if needed Nicki ReaperBAITY, REGINA, NP

## 2017-08-02 ENCOUNTER — Telehealth: Payer: Self-pay | Admitting: Internal Medicine

## 2017-08-02 NOTE — Telephone Encounter (Signed)
Patient is calling to get Julie Jimenez's opinion on her labs. She states she is so sorry that she has bothered you today she is just anxious to hear what she thinks.

## 2017-08-02 NOTE — Telephone Encounter (Signed)
Copied from CRM [phone]. Topic: Quick Communication - See Telephone Encounter >> Aug 02, 2017 12:06 PM Clack, Princella PellegriniJessica D wrote: CRM for notification. See Telephone encounter for:  Pt wanted to go over lab results. 08/02/17.

## 2017-08-02 NOTE — Telephone Encounter (Signed)
Copied from CRM #[id number]. Topic: Quick Communication - Lab Results >> Aug 02, 2017 12:04 PM Clack, Princella PellegriniJessica D wrote:  Pt wanted to go lab results.

## 2017-08-02 NOTE — Telephone Encounter (Signed)
Pt would like to go over lab results

## 2017-09-02 ENCOUNTER — Ambulatory Visit: Payer: Self-pay | Admitting: Obstetrics and Gynecology

## 2017-10-02 ENCOUNTER — Encounter: Payer: Self-pay | Admitting: Obstetrics and Gynecology

## 2017-10-02 ENCOUNTER — Ambulatory Visit (INDEPENDENT_AMBULATORY_CARE_PROVIDER_SITE_OTHER): Payer: BLUE CROSS/BLUE SHIELD | Admitting: Obstetrics and Gynecology

## 2017-10-02 VITALS — BP 114/68 | HR 78 | Ht 70.0 in | Wt 184.0 lb

## 2017-10-02 DIAGNOSIS — N941 Unspecified dyspareunia: Secondary | ICD-10-CM

## 2017-10-02 DIAGNOSIS — Z1151 Encounter for screening for human papillomavirus (HPV): Secondary | ICD-10-CM | POA: Diagnosis not present

## 2017-10-02 DIAGNOSIS — Z01419 Encounter for gynecological examination (general) (routine) without abnormal findings: Secondary | ICD-10-CM | POA: Diagnosis not present

## 2017-10-02 DIAGNOSIS — Z124 Encounter for screening for malignant neoplasm of cervix: Secondary | ICD-10-CM

## 2017-10-02 NOTE — Patient Instructions (Signed)
I value your feedback and entrusting us with your care. If you get a [facility] patient survey, I would appreciate you taking the time to let us know about your experience today. Thank you! 

## 2017-10-02 NOTE — Progress Notes (Signed)
PCP:  Lorre Munroe, NP   Chief Complaint  Patient presents with  . Gynecologic Exam    vaginal cyst few months ago  . Painful intercourse     HPI:      [name] is a 37 y.o. No obstetric history on file. who LMP was Patient's last menstrual period was 09/17/2017 (exact date)., presents today for her annual examination.  Her menses are regular every 28-30 days, lasting 5 days.  Dysmenorrhea none. She does not have intermenstrual bleeding.  Sex activity: single partner, contraception - rhythm method. Has noticed deep dyspareunia for a few yrs, but sx occur about once a month around her period. She uses lubricant prn with ext relief of sx. Last Pap: June 15, 2014  Results were: no abnormalities /neg HPV DNA  Hx of STDs: HPV  Hx of RT breast cyst, stable on SBE.  There is no FH of breast cancer. There is no FH of ovarian cancer. The patient does not do self-breast exams.  Tobacco use: 3-5 cigs daily Alcohol use: glass of wine with dinner No drug use.  Exercise: very active  She does get adequate calcium and Vitamin D in her diet.   Past Medical History:  Diagnosis Date  . Anxiety   . Attention deficit disorder (ADD)   . Breast cyst, right   . Hemorrhoids   . History of shingles 2013  . LGSIL on Pap smear of cervix 02/17/2003    Past Surgical History:  Procedure Laterality Date  . EXTERNAL EAR SURGERY    . TONSILLECTOMY AND ADENOIDECTOMY      Family History  Problem Relation Age of Onset  . Heart disease Paternal Grandmother   . Heart disease Paternal Grandfather     Social History   Socioeconomic History  . Marital status: Single    Spouse name: Not on file  . Number of children: Not on file  . Years of education: Not on file  . Highest education level: Not on file  Social Needs  . Financial resource strain: Not on file  . Food insecurity - worry: Not on file  . Food insecurity - inability: Not on file  . Transportation needs - medical:  Not on file  . Transportation needs - non-medical: Not on file  Occupational History  . Not on file  Tobacco Use  . Smoking status: Current Some Day Smoker    Packs/day: 0.25    Years: 10.00    Pack years: 2.50    Types: Cigarettes  . Smokeless tobacco: Never Used  Substance and Sexual Activity  . Alcohol use: Yes    Alcohol/week: 4.8 oz    Types: 8 Glasses of wine per week    Comment: moderate  . Drug use: No  . Sexual activity: Yes    Birth control/protection: None  Other Topics Concern  . Not on file  Social History Narrative  . Not on file    No outpatient medications have been marked as taking for the 10/02/17 encounter (Office Visit) with Jassica Zazueta, Ilona Sorrel, PA-C.     ROS:  Review of Systems  Constitutional: Negative for fatigue, fever and unexpected weight change.  Respiratory: Negative for cough, shortness of breath and wheezing.   Cardiovascular: Negative for chest pain, palpitations and leg swelling.  Gastrointestinal: Negative for blood in stool, constipation, diarrhea, nausea and vomiting.  Endocrine: Negative for cold intolerance, heat intolerance and polyuria.  Genitourinary: Negative for dyspareunia, dysuria, flank pain, frequency, genital  sores, hematuria, menstrual problem, pelvic pain, urgency, vaginal bleeding, vaginal discharge and vaginal pain.  Musculoskeletal: Negative for back pain, joint swelling and myalgias.  Skin: Negative for rash.  Neurological: Negative for dizziness, syncope, light-headedness, numbness and headaches.  Hematological: Negative for adenopathy.  Psychiatric/Behavioral: Negative for agitation, confusion, sleep disturbance and suicidal ideas. The patient is not nervous/anxious.      Objective: BP 114/68 (BP Location: Left Arm, Patient Position: Sitting, Cuff Size: Normal)   Pulse 78   Ht 5\' 10"  (1.778 m)   Wt 184 lb (83.5 kg)   LMP 09/17/2017 (Exact Date)   BMI 26.40 kg/m    Physical Exam  Constitutional: She is oriented  to person, place, and time. She appears well-developed and well-nourished.  Genitourinary: Vagina normal and uterus normal. There is no rash or tenderness on the right labia. There is no rash or tenderness on the left labia. No erythema or tenderness in the vagina. No vaginal discharge found. Right adnexum does not display mass and does not display tenderness. Left adnexum does not display mass and does not display tenderness. Cervix does not exhibit motion tenderness or polyp. Uterus is not enlarged or tender.  Neck: Normal range of motion. No thyromegaly present.  Cardiovascular: Normal rate, regular rhythm and normal heart sounds.  No murmur heard. Pulmonary/Chest: Effort normal and breath sounds normal. Right breast exhibits no mass, no nipple discharge, no skin change and no tenderness. Left breast exhibits no mass, no nipple discharge, no skin change and no tenderness.  Abdominal: Soft. There is no tenderness. There is no guarding.  Musculoskeletal: Normal range of motion.  Neurological: She is alert and oriented to person, place, and time. No cranial nerve deficit.  Psychiatric: She has a normal mood and affect. Her behavior is normal.  Vitals reviewed.   Assessment/Plan: Encounter for annual routine gynecological examination  Cervical cancer screening - Plan: IGP, Aptima HPV  Screening for HPV (human papillomavirus) - Plan: IGP, Aptima HPV  Dyspareunia in female - See if sx before menses, which can happen. Try different positions. F/u for further eval prn. No pelvic pain otherwise.  GYN counsel breast self exam, adequate intake of calcium and vitamin D, diet and exercise, tobacco use     F/U  Return in about 1 year (around 10/02/2018).  Leiyah Maultsby B. Jalynne Persico, PA-C 10/02/2017 2:14 PM

## 2017-10-04 LAB — IGP, APTIMA HPV
HPV APTIMA: NEGATIVE
PAP SMEAR COMMENT: 0

## 2017-11-28 ENCOUNTER — Ambulatory Visit: Payer: Self-pay | Admitting: *Deleted

## 2017-11-28 NOTE — Telephone Encounter (Signed)
Pt has tingling that comes and goes in her left arm when she moves her head a certain way. [place] and she moved her head a certain way and had left jaw pain. She denies nausea, vomiting, sweating,chest pain, weakness or trouble walking. About 2 weeks ago she tried to catch a cabinet that was falling and thought she had developed a pinched nerve from that.  No protocol per her description of her symptoms. Appointment made for tomorrow. Advised to call back if her symptoms become worst, pt voiced understanding.  Answer Assessment - Initial Assessment Questions 1. SYMPTOM: "What is the main symptom you are concerned about?" (e.g., weakness, numbness)    tingling 2. ONSET: "When did this start?" (minutes, hours, days; while sleeping)     Almost 2 weeks 3. LAST NORMAL: "When was the last time you were normal (no symptoms)?"     The 2 weeks before the tingling started. 4. PATTERN "Does this come and go, or has it been constant since it started?"  "Is it present now?"     Comes goes, does not have it right now 5. CARDIAC SYMPTOMS: "Have you had any of the following symptoms: chest pain, difficulty breathing, palpitations?"     no 6. NEUROLOGIC SYMPTOMS: "Have you had any of the following symptoms: headache, dizziness, vision loss, double vision, changes in speech, unsteady on your feet?"     none 7. OTHER SYMPTOMS: "Do you have any other symptoms?"     no 8. PREGNANCY: "Is there any chance you are pregnant?" "When was your last menstrual period?"     No, LMP just finished  Protocols used: NEUROLOGIC DEFICIT-A-AH

## 2017-11-29 ENCOUNTER — Ambulatory Visit: Payer: BLUE CROSS/BLUE SHIELD | Admitting: Internal Medicine

## 2017-11-29 ENCOUNTER — Encounter: Payer: Self-pay | Admitting: Internal Medicine

## 2017-11-29 VITALS — BP 120/76 | HR 96 | Temp 98.2°F | Wt 185.0 lb

## 2017-11-29 DIAGNOSIS — M5412 Radiculopathy, cervical region: Secondary | ICD-10-CM

## 2017-11-29 MED ORDER — PREDNISONE 10 MG PO TABS: ORAL_TABLET | ORAL | 0 refills | Status: DC

## 2017-11-29 NOTE — Patient Instructions (Signed)
Cervical Radiculopathy  Cervical radiculopathy means that a nerve in the neck is pinched or bruised. This can cause pain or loss of feeling (numbness) that runs from your neck to your arm and fingers.  Follow these instructions at home:  Managing pain  ? Take over-the-counter and prescription medicines only as told by your doctor.  ? If directed, put ice on the injured or painful area.  ? Put ice in a plastic bag.  ? Place a towel between your skin and the bag.  ? Leave the ice on for 20 minutes, 2?3 times per day.  ? If ice does not help, you can try using heat. Take a warm shower or warm bath, or use a heat pack as told by your doctor.  ? You may try a gentle neck and shoulder massage.  Activity  ? Rest as needed. Follow instructions from your doctor about any activities to avoid.  ? Do exercises as told by your doctor or physical therapist.  General instructions  ? If you were given a soft collar, wear it as told by your doctor.  ? Use a flat pillow when you sleep.  ? Keep all follow-up visits as told by your doctor. This is important.  Contact a doctor if:  ? Your condition does not improve with treatment.  Get help right away if:  ? Your pain gets worse and is not controlled with medicine.  ? You lose feeling or feel weak in your hand, arm, face, or leg.  ? You have a fever.  ? You have a stiff neck.  ? You cannot control when you poop or pee (have incontinence).  ? You have trouble with walking, balance, or talking.  This information is not intended to replace advice given to you by your health care provider. Make sure you discuss any questions you have with your health care provider.  Document Released: 08/30/2011 Document Revised: 02/16/2016 Document Reviewed: 11/04/2014  Elsevier Interactive Patient Education ? 2018 [employer]

## 2017-11-29 NOTE — Progress Notes (Signed)
Subjective:    Patient ID: Julie Jimenez, female    DOB: [birth year], 37 y.o.   MRN: [id number]  HPI  Pt presents to the clinic today with c/o intermittent numbness and tingling in her left arm. She reports this started 2 weeks ago, after she was trying to move a heavy cabinet. Since that time, when she moves her neck a certain way, she has tingling in her left arm. She has also had some pain that has radiated into her jaw. She denies chest pain or chest tightness. She denies pain that radiates into her back or shortness of breath. She denies weakness of her left arm. She has taken Aleve and gotten a massage with some relief.  Review of Systems      Past Medical History:  Diagnosis Date  . Anxiety   . Attention deficit disorder (ADD)   . Breast cyst, right   . Hemorrhoids   . History of shingles 2013  . LGSIL on Pap smear of cervix 02/17/2003    Current Outpatient Medications  Medication Sig Dispense Refill  . Multiple Vitamin (MULTIVITAMIN) tablet Take 1 tablet daily by mouth.     No current facility-administered medications for this visit.     Allergies  Allergen Reactions  . Augmentin [Amoxicillin-Pot Clavulanate] Rash  . Codeine Nausea And Vomiting and Rash    Family History  Problem Relation Age of Onset  . Heart disease Paternal Grandmother   . Heart disease Paternal Grandfather     Social History   Socioeconomic History  . Marital status: Single    Spouse name: Not on file  . Number of children: Not on file  . Years of education: Not on file  . Highest education level: Not on file  Social Needs  . Financial resource strain: Not on file  . Food insecurity - worry: Not on file  . Food insecurity - inability: Not on file  . Transportation needs - medical: Not on file  . Transportation needs - non-medical: Not on file  Occupational History  . Not on file  Tobacco Use  . Smoking status: Current Some Day Smoker    Packs/day: 0.25    Years: 10.00    Pack  years: 2.50    Types: Cigarettes  . Smokeless tobacco: Never Used  Substance and Sexual Activity  . Alcohol use: Yes    Alcohol/week: 4.8 oz    Types: 8 Glasses of wine per week    Comment: moderate  . Drug use: No  . Sexual activity: Yes    Birth control/protection: None  Other Topics Concern  . Not on file  Social History Narrative  . Not on file     Constitutional: Denies fever, malaise, fatigue, headache or abrupt weight changes.  Musculoskeletal: Denies decrease in range of motion, difficulty with gait, muscle pain or joint pain and swelling.  Skin: Denies redness, rashes, lesions or ulcercations.  Neurological: Pt reports numbness and tingling in left arm. Denies dizziness, difficulty with memory, difficulty with speech or problems with balance and coordination.    No other specific complaints in a complete review of systems (except as listed in HPI above).  Objective:   Physical Exam  BP 120/76   Pulse 96   Temp 98.2 F (36.8 C) (Oral)   Wt 185 lb (83.9 kg)   LMP 11/20/2017   SpO2 98%   BMI 26.54 kg/m  Wt Readings from Last 3 Encounters:  11/29/17 185 lb (83.9 kg)  10/02/17  184 lb (83.5 kg)  08/01/17 177 lb (80.3 kg)    General: Appears her stated age, well developed, well nourished in NAD. Skin: No rashes noted.. Musculoskeletal: Normal flexion, extension and rotation of the cervical spine. No bony tenderness noted over the spine. She can elicit the numbness with lateral bending to the left. Strength 5/5 BUE.  Neurological: Alert and oriented. Sensation intact to BUE.   BMET    Component Value Date/Time   NA 137 08/01/2017 1445   NA 141 05/27/2014 0122   K 3.9 08/01/2017 1445   K 3.5 05/27/2014 0122   CL 103 08/01/2017 1445   CL 107 05/27/2014 0122   CO2 29 08/01/2017 1445   CO2 24 05/27/2014 0122   GLUCOSE 85 08/01/2017 1445   GLUCOSE 96 05/27/2014 0122   BUN 10 08/01/2017 1445   BUN 9 05/27/2014 0122   CREATININE 0.75 08/01/2017 1445    CREATININE 0.70 07/08/2015 1505   CALCIUM 9.3 08/01/2017 1445   CALCIUM 8.9 05/27/2014 0122   GFRNONAA >60 05/27/2014 0122   GFRAA >60 05/27/2014 0122    Lipid Panel     Component Value Date/Time   CHOL 170 08/01/2017 1445   TRIG 44.0 08/01/2017 1445   HDL 53.80 08/01/2017 1445   CHOLHDL 3 08/01/2017 1445   VLDL 8.8 08/01/2017 1445   LDLCALC 108 (H) 08/01/2017 1445    CBC    Component Value Date/Time   WBC 12.1 (H) 08/01/2017 1445   RBC 4.85 08/01/2017 1445   HGB 15.3 (H) 08/01/2017 1445   HGB 16.5 (H) 05/27/2014 0122   HCT 46.0 08/01/2017 1445   HCT 51.0 (H) 05/27/2014 0122   PLT 352.0 08/01/2017 1445   PLT 352 05/27/2014 0122   MCV 94.9 08/01/2017 1445   MCV 96 05/27/2014 0122   MCH 30.7 07/08/2015 1505   MCHC 33.2 08/01/2017 1445   RDW 12.2 08/01/2017 1445   RDW 12.6 05/27/2014 0122   LYMPHSABS 3.8 (H) 05/27/2014 0122   MONOABS 0.7 05/27/2014 0122   EOSABS 0.3 05/27/2014 0122   BASOSABS 0.1 05/27/2014 0122    Hgb A1C Lab Results  Component Value Date   HGBA1C 5.1 07/08/2015           Assessment & Plan:   Cervical Radiculitis:  eRx for Pred Taper x 9 days Discussed neck exercises Continue massage therapy If worsens, consider xray vs PT  Return precautions discussed Nicki ReaperBAITY, Yuto Cajuste, NP

## 2018-04-30 ENCOUNTER — Ambulatory Visit: Payer: BLUE CROSS/BLUE SHIELD | Admitting: Family Medicine

## 2018-04-30 ENCOUNTER — Encounter: Payer: Self-pay | Admitting: Family Medicine

## 2018-04-30 VITALS — BP 110/80 | HR 99 | Temp 98.6°F | Ht 68.75 in | Wt 172.5 lb

## 2018-04-30 DIAGNOSIS — H65112 Acute and subacute allergic otitis media (mucoid) (sanguinous) (serous), left ear: Secondary | ICD-10-CM | POA: Diagnosis not present

## 2018-04-30 DIAGNOSIS — J069 Acute upper respiratory infection, unspecified: Secondary | ICD-10-CM | POA: Diagnosis not present

## 2018-04-30 MED ORDER — AZITHROMYCIN 250 MG PO TABS: ORAL_TABLET | ORAL | 0 refills | Status: DC

## 2018-04-30 NOTE — Progress Notes (Signed)
Dr. Karleen Hampshire T. Brallan Denio, MD, CAQ [facility] [address] [place], [address] Phone: [phone] Fax: [phone]  04/30/2018  Patient: Julie Jimenez, MRN: [id number], DOB: 11-15-1980, 37 y.o.  Primary Physician:  Lorre Munroe, NP   Chief Complaint  Patient presents with  . Otalgia  . Sore Throat  . Headache  . Chills  . Nasal Congestion   Subjective:   Julie Jimenez is a 37 y.o. very pleasant female patient who presents with the following:  Having some decreased energy. Monday did not sleep well. Cold chills all day yesterday, did not have a temp, hard to swallow overall. Stayed at home yesterday. Taking some ibuprofen.   Congested really bad.  She is having some ear pain in the L ear. Feels terrible, achy Runny nose [address]   Past Medical History, Surgical History, Social History, Family History, Problem List, Medications, and Allergies have been reviewed and updated if relevant.  Patient Active Problem List   Diagnosis Date Noted  . ADHD (attention deficit hyperactivity disorder) 11/12/2013  . Generalized anxiety disorder 11/12/2013    Past Medical History:  Diagnosis Date  . Anxiety   . Attention deficit disorder (ADD)   . Breast cyst, right   . Hemorrhoids   . History of shingles 2013  . LGSIL on Pap smear of cervix 02/17/2003    Past Surgical History:  Procedure Laterality Date  . EXTERNAL EAR SURGERY    . TONSILLECTOMY AND ADENOIDECTOMY      Social History   Socioeconomic History  . Marital status: Single    Spouse name: Not on file  . Number of children: Not on file  . Years of education: Not on file  . Highest education level: Not on file  Occupational History  . Not on file  Social Needs  . Financial resource strain: Not on file  . Food insecurity:    Worry: Not on file    Inability: Not on file  . Transportation needs:    Medical: Not on file    Non-medical: Not on file  Tobacco Use  .  Smoking status: Current Some Day Smoker    Packs/day: 0.25    Years: 10.00    Pack years: 2.50    Types: Cigarettes  . Smokeless tobacco: Never Used  Substance and Sexual Activity  . Alcohol use: Yes    Alcohol/week: 4.8 oz    Types: 8 Glasses of wine per week    Comment: moderate  . Drug use: No  . Sexual activity: Yes    Birth control/protection: None  Lifestyle  . Physical activity:    Days per week: 5 days    Minutes per session: 60 min  . Stress: Rather much  Relationships  . Social connections:    Talks on phone: More than three times a week    Gets together: Three times a week    Attends religious service: More than 4 times per year    Active member of club or organization: Yes    Attends meetings of clubs or organizations: More than 4 times per year    Relationship status: Living with partner  . Intimate partner violence:    Fear of current or ex partner: No    Emotionally abused: No    Physically abused: No    Forced sexual activity: No  Other Topics Concern  . Not on file  Social History Narrative  . Not on  file    Family History  Problem Relation Age of Onset  . Heart disease Paternal Grandmother   . Heart disease Paternal Grandfather     Allergies  Allergen Reactions  . Augmentin [Amoxicillin-Pot Clavulanate] Rash  . Codeine Nausea And Vomiting and Rash    Medication list reviewed and updated in full in [facility].  ROS: GEN: Acute illness details above GI: Tolerating PO intake GU: maintaining adequate hydration and urination Pulm: No SOB Interactive and getting along well at home.  Otherwise, ROS is as per the HPI.  Objective:   BP 110/80   Pulse 99   Temp 98.6 F (37 C) (Oral)   Ht 5' 8.75" (1.746 m)   Wt 172 lb 8 oz (78.2 kg)   LMP 04/14/2018 (Approximate)   SpO2 97%   BMI 25.66 kg/m    Gen: WDWN, NAD; A & O x3, cooperative. Pleasant.Globally Non-toxic HEENT: Normocephalic and atraumatic. Throat clear, w/o exudate, R TM  clear, L TM - bulging tm with absent landmarks. rhinnorhea.  MMM Frontal sinuses: NT Max sinuses: NT NECK: Anterior cervical  LAD is absent CV: RRR, No M/G/R, cap refill <2 sec PULM: Breathing comfortably in no respiratory distress. no wheezing, crackles, rhonchi EXT: No c/c/e PSYCH: Friendly, good eye contact MSK: Nml gait     Laboratory and Imaging Data:  Assessment and Plan:   Acute mucoid otitis media of left ear  URI, acute  Uri with early om ? amox allergy  Follow-up: No follow-ups on file.  Meds ordered this encounter  Medications  . azithromycin (ZITHROMAX) 250 MG tablet    Sig: 2 tabs po on day 1, then 1 tab po for 4 days    Dispense:  6 tablet    Refill:  0    Signed,  Zuzanna Maroney T. Jamaia Brum, MD   Allergies as of 04/30/2018      Reactions   Augmentin [amoxicillin-pot Clavulanate] Rash   Codeine Nausea And Vomiting, Rash      Medication List        Accurate as of 04/30/18 11:59 PM. Always use your most recent med list.          azithromycin 250 MG tablet Commonly known as:  ZITHROMAX 2 tabs po on day 1, then 1 tab po for 4 days

## 2018-07-10 ENCOUNTER — Ambulatory Visit (INDEPENDENT_AMBULATORY_CARE_PROVIDER_SITE_OTHER): Payer: BLUE CROSS/BLUE SHIELD

## 2018-07-10 DIAGNOSIS — Z23 Encounter for immunization: Secondary | ICD-10-CM

## 2018-07-30 ENCOUNTER — Telehealth: Payer: Self-pay | Admitting: Internal Medicine

## 2018-07-30 NOTE — Telephone Encounter (Signed)
Noted. I'm sorry if she's upset. The appt was scheduled wrong by the [facility]. 1st issue is that it was in a 15 minute slot. Second issue is that it was scheduled back to back with another physical. We offered appts to accomodate but she refused.

## 2018-07-30 NOTE — Telephone Encounter (Signed)
I left a detailed message on patient's voice mail to call back and reschedule cpx with Nicki Reaper on 09/04/18.  Appointment was scheduled for 15 minutes.  If patient returns my call, please transfer call to me. If I'm unavailable, you can reschedule the physical.

## 2018-07-30 NOTE — Telephone Encounter (Signed)
Pt called back today to reschedule. She was unhappy about rescheduling. Was given the approval by Mel D to reschedule for the Same day 09/04/18 @ 12:15pm instead. Pt stated she can not do that time because she has a showing for a house. Scheduled physical for 09/30/17 because she couldn't do 09/25/17.

## 2018-09-04 ENCOUNTER — Ambulatory Visit: Payer: BLUE CROSS/BLUE SHIELD | Admitting: Internal Medicine

## 2018-09-30 ENCOUNTER — Encounter: Payer: BLUE CROSS/BLUE SHIELD | Admitting: Internal Medicine

## 2019-07-22 ENCOUNTER — Other Ambulatory Visit: Payer: Self-pay

## 2019-07-22 DIAGNOSIS — Z20822 Contact with and (suspected) exposure to covid-19: Secondary | ICD-10-CM

## 2019-07-23 LAB — NOVEL CORONAVIRUS, NAA: SARS-CoV-2, NAA: NOT DETECTED

## 2019-09-29 ENCOUNTER — Encounter: Payer: BLUE CROSS/BLUE SHIELD | Admitting: Internal Medicine

## 2019-11-05 ENCOUNTER — Other Ambulatory Visit: Payer: Self-pay

## 2019-11-05 ENCOUNTER — Ambulatory Visit (INDEPENDENT_AMBULATORY_CARE_PROVIDER_SITE_OTHER): Payer: 59 | Admitting: Internal Medicine

## 2019-11-05 ENCOUNTER — Encounter: Payer: Self-pay | Admitting: Internal Medicine

## 2019-11-05 VITALS — BP 112/76 | HR 74 | Temp 97.9°F | Ht 69.0 in | Wt 160.0 lb

## 2019-11-05 DIAGNOSIS — F411 Generalized anxiety disorder: Secondary | ICD-10-CM

## 2019-11-05 DIAGNOSIS — Z0000 Encounter for general adult medical examination without abnormal findings: Secondary | ICD-10-CM | POA: Diagnosis not present

## 2019-11-05 DIAGNOSIS — F902 Attention-deficit hyperactivity disorder, combined type: Secondary | ICD-10-CM | POA: Diagnosis not present

## 2019-11-05 LAB — LIPID PANEL
Cholesterol: 161 mg/dL (ref 0–200)
HDL: 58.2 mg/dL (ref >39.00)
LDL Cholesterol: 96 mg/dL (ref 0–99)
NonHDL: 102.78
Total CHOL/HDL Ratio: 3
Triglycerides: 33 mg/dL (ref 0.0–149.0)
VLDL: 6.6 mg/dL (ref 0.0–40.0)

## 2019-11-05 LAB — COMPREHENSIVE METABOLIC PANEL
ALT: 16 U/L (ref 0–35)
AST: 21 U/L (ref 0–37)
Albumin: 4.1 g/dL (ref 3.5–5.2)
Alkaline Phosphatase: 37 U/L — ABNORMAL LOW (ref 39–117)
BUN: 8 mg/dL (ref 6–23)
CO2: 27 mEq/L (ref 19–32)
Calcium: 8.8 mg/dL (ref 8.4–10.5)
Chloride: 105 mEq/L (ref 96–112)
Creatinine, Ser: 0.73 mg/dL (ref 0.40–1.20)
GFR: 88.92 mL/min (ref >60.00)
Glucose, Bld: 81 mg/dL (ref 70–99)
Potassium: 4 mEq/L (ref 3.5–5.1)
Sodium: 136 mEq/L (ref 135–145)
Total Bilirubin: 1.6 mg/dL — ABNORMAL HIGH (ref 0.2–1.2)
Total Protein: 5.8 g/dL — ABNORMAL LOW (ref 6.0–8.3)

## 2019-11-05 LAB — CBC
HCT: 43.3 % (ref 36.0–46.0)
Hemoglobin: 14.2 g/dL (ref 12.0–15.0)
MCHC: 32.7 g/dL (ref 30.0–36.0)
MCV: 97.4 fl (ref 78.0–100.0)
Platelets: 290 10*3/uL (ref 150.0–400.0)
RBC: 4.45 Mil/uL (ref 3.87–5.11)
RDW: 13 % (ref 11.5–15.5)
WBC: 7.3 10*3/uL (ref 4.0–10.5)

## 2019-11-05 NOTE — Progress Notes (Signed)
Subjective:    Patient ID: Julie Jimenez, female    DOB: 09-Jul-1981, 39 y.o.   MRN: [id number]  HPI  Pt presents to the clinic today for her annual exam. She is also due to follow up chronic conditions.  ADHD: Currently not issue. She is not taking any medications for this at this time.  GAD: Intermittent. She is not currently taking medications at this time. She no longer follows with a therapist.  Flu: 06/2019 Tetanus: 06/2016 Pap Smear: 09/2017 Dentist: biannually  Diet: She does eat meat. She consumes fruits and veggies daily. She tries to avoid fried foods. She drinks mostly black coffee, water. Exercise: Biking most days.  Review of Systems  Past Medical History:  Diagnosis Date  . Anxiety   . Attention deficit disorder (ADD)   . Breast cyst, right   . Hemorrhoids   . History of shingles 2013  . LGSIL on Pap smear of cervix 02/17/2003    Current Outpatient Medications  Medication Sig Dispense Refill  . azithromycin (ZITHROMAX) 250 MG tablet 2 tabs po on day 1, then 1 tab po for 4 days 6 tablet 0   No current facility-administered medications for this visit.    Allergies  Allergen Reactions  . Augmentin [Amoxicillin-Pot Clavulanate] Rash  . Codeine Nausea And Vomiting and Rash    Family History  Problem Relation Age of Onset  . Heart disease Paternal Grandmother   . Heart disease Paternal Grandfather     Social History   Socioeconomic History  . Marital status: Single    Spouse name: Not on file  . Number of children: Not on file  . Years of education: Not on file  . Highest education level: Not on file  Occupational History  . Not on file  Tobacco Use  . Smoking status: Current Some Day Smoker    Packs/day: 0.25    Years: 10.00    Pack years: 2.50    Types: Cigarettes  . Smokeless tobacco: Never Used  Substance and Sexual Activity  . Alcohol use: Yes    Alcohol/week: 8.0 standard drinks    Types: 8 Glasses of wine per week    Comment:  moderate  . Drug use: No  . Sexual activity: Yes    Birth control/protection: None  Other Topics Concern  . Not on file  Social History Narrative  . Not on file   Social Determinants of Health   Financial Resource Strain:   . Difficulty of Paying Living Expenses: Not on file  Food Insecurity:   . Worried About Charity fundraiser in the Last Year: Not on file  . Ran Out of Food in the Last Year: Not on file  Transportation Needs:   . Lack of Transportation (Medical): Not on file  . Lack of Transportation (Non-Medical): Not on file  Physical Activity:   . Days of Exercise per Week: Not on file  . Minutes of Exercise per Session: Not on file  Stress:   . Feeling of Stress : Not on file  Social Connections:   . Frequency of Communication with Friends and Family: Not on file  . Frequency of Social Gatherings with Friends and Family: Not on file  . Attends Religious Services: Not on file  . Active Member of Clubs or Organizations: Not on file  . Attends Archivist Meetings: Not on file  . Marital Status: Not on file  Intimate Partner Violence:   . Fear of Current or  Ex-Partner: Not on file  . Emotionally Abused: Not on file  . Physically Abused: Not on file  . Sexually Abused: Not on file     Constitutional: Denies fever, malaise, fatigue, headache or abrupt weight changes.  HEENT: Denies eye pain, eye redness, ear pain, ringing in the ears, wax buildup, runny nose, nasal congestion, bloody nose, or sore throat. Respiratory: Denies difficulty breathing, shortness of breath, cough or sputum production.   Cardiovascular: Denies chest pain, chest tightness, palpitations or swelling in the hands or feet.  Gastrointestinal: Denies abdominal pain, bloating, constipation, diarrhea or blood in the stool.  GU: Denies urgency, frequency, pain with urination, burning sensation, blood in urine, odor or discharge. Musculoskeletal: Denies decrease in range of motion, difficulty  with gait, muscle pain or joint pain and swelling.  Skin: Denies redness, rashes, lesions or ulcercations.  Neurological: Denies dizziness, difficulty with memory, difficulty with speech or problems with balance and coordination.  Psych: Denies anxiety, depression, SI/HI.  No other specific complaints in a complete review of systems (except as listed in HPI above).     Objective:   Physical Exam  BP 112/76   Pulse 74   Temp 97.9 F (36.6 C) (Temporal)   Ht 5\' 9"  (1.753 m)   Wt 160 lb (72.6 kg)   LMP 10/14/2019   SpO2 98%   BMI 23.63 kg/m   Wt Readings from Last 3 Encounters:  04/30/18 172 lb 8 oz (78.2 kg)  11/29/17 185 lb (83.9 kg)  10/02/17 184 lb (83.5 kg)    General: Appears her stated age, well developed, well nourished in NAD. Skin: Warm, dry and intact. No rashes noted. HEENT: Head: normal shape and size; Eyes: sclera white, no icterus, conjunctiva pink, PERRLA and EOMs intact; Ears: Tm's gray and intact, normal light reflex;  Neck:  Neck supple, trachea midline. No masses, lumps or thyromegaly present.  Cardiovascular: Normal rate and rhythm. S1,S2 noted.  No murmur, rubs or gallops noted. No JVD or BLE edema. Pulmonary/Chest: Normal effort and positive vesicular breath sounds. No respiratory distress. No wheezes, rales or ronchi noted.  Abdomen: Soft and nontender. Normal bowel sounds. No distention or masses noted. Liver, spleen and kidneys non palpable. Musculoskeletal: Strength 5/5 BUE/BLE. No difficulty with gait.  Neurological: Alert and oriented. Cranial nerves II-XII grossly intact. Coordination normal.  Psychiatric: Mood and affect normal. Behavior is normal. Judgment and thought content normal.     BMET    Component Value Date/Time   NA 137 08/01/2017 1445   NA 141 05/27/2014 0122   K 3.9 08/01/2017 1445   K 3.5 05/27/2014 0122   CL 103 08/01/2017 1445   CL 107 05/27/2014 0122   CO2 29 08/01/2017 1445   CO2 24 05/27/2014 0122   GLUCOSE 85  08/01/2017 1445   GLUCOSE 96 05/27/2014 0122   BUN 10 08/01/2017 1445   BUN 9 05/27/2014 0122   CREATININE 0.75 08/01/2017 1445   CREATININE 0.70 07/08/2015 1505   CALCIUM 9.3 08/01/2017 1445   CALCIUM 8.9 05/27/2014 0122   GFRNONAA >60 05/27/2014 0122   GFRAA >60 05/27/2014 0122    Lipid Panel     Component Value Date/Time   CHOL 170 08/01/2017 1445   TRIG 44.0 08/01/2017 1445   HDL 53.80 08/01/2017 1445   CHOLHDL 3 08/01/2017 1445   VLDL 8.8 08/01/2017 1445   LDLCALC 108 (H) 08/01/2017 1445    CBC    Component Value Date/Time   WBC 12.1 (H) 08/01/2017 1445  RBC 4.85 08/01/2017 1445   HGB 15.3 (H) 08/01/2017 1445   HGB 16.5 (H) 05/27/2014 0122   HCT 46.0 08/01/2017 1445   HCT 51.0 (H) 05/27/2014 0122   PLT 352.0 08/01/2017 1445   PLT 352 05/27/2014 0122   MCV 94.9 08/01/2017 1445   MCV 96 05/27/2014 0122   MCH 30.7 07/08/2015 1505   MCHC 33.2 08/01/2017 1445   RDW 12.2 08/01/2017 1445   RDW 12.6 05/27/2014 0122   LYMPHSABS 3.8 (H) 05/27/2014 0122   MONOABS 0.7 05/27/2014 0122   EOSABS 0.3 05/27/2014 0122   BASOSABS 0.1 05/27/2014 0122    Hgb A1C Lab Results  Component Value Date   HGBA1C 5.1 07/08/2015         Assessment & Plan:   Preventative Health Maintenance:  Flu shot UTD Tetanus UTD Pap smear UTD Encouraged her to consume a balanced diet and exercise regimen Advised her to see a dentist annually Will check CBC, CMET and Lipid profile today  RTC in 1 year, sooner if needed Nicki Reaper, NP This visit occurred during the SARS-CoV-2 public health emergency.  Safety protocols were in place, including screening questions prior to the visit, additional usage of staff PPE, and extensive cleaning of exam room while observing appropriate contact time as indicated for disinfecting solutions.

## 2019-11-05 NOTE — Patient Instructions (Signed)
Health Maintenance, Female Adopting a healthy lifestyle and getting preventive care are important in promoting health and wellness. Ask your health care provider about:  The right schedule for you to have regular tests and exams.  Things you can do on your own to prevent diseases and keep yourself healthy. What should I know about diet, weight, and exercise? Eat a healthy diet   Eat a diet that includes plenty of vegetables, fruits, low-fat dairy products, and lean protein.  Do not eat a lot of foods that are high in solid fats, added sugars, or sodium. Maintain a healthy weight Body mass index (BMI) is used to identify weight problems. It estimates body fat based on height and weight. Your health care provider can help determine your BMI and help you achieve or maintain a healthy weight. Get regular exercise Get regular exercise. This is one of the most important things you can do for your health. Most adults should:  Exercise for at least 150 minutes each week. The exercise should increase your heart rate and make you sweat (moderate-intensity exercise).  Do strengthening exercises at least twice a week. This is in addition to the moderate-intensity exercise.  Spend less time sitting. Even light physical activity can be beneficial. Watch cholesterol and blood lipids Have your blood tested for lipids and cholesterol at 39 years of age, then have this test every 5 years. Have your cholesterol levels checked more often if:  Your lipid or cholesterol levels are high.  You are older than 40 years of age.  You are at high risk for heart disease. What should I know about cancer screening? Depending on your health history and family history, you may need to have cancer screening at various ages. This may include screening for:  Breast cancer.  Cervical cancer.  Colorectal cancer.  Skin cancer.  Lung cancer. What should I know about heart disease, diabetes, and high blood  pressure? Blood pressure and heart disease  High blood pressure causes heart disease and increases the risk of stroke. This is more likely to develop in people who have high blood pressure readings, are of African descent, or are overweight.  Have your blood pressure checked: ? Every 3-5 years if you are 18-39 years of age. ? Every year if you are 40 years old or older. Diabetes Have regular diabetes screenings. This checks your fasting blood sugar level. Have the screening done:  Once every three years after age 40 if you are at a normal weight and have a low risk for diabetes.  More often and at a younger age if you are overweight or have a high risk for diabetes. What should I know about preventing infection? Hepatitis B If you have a higher risk for hepatitis B, you should be screened for this virus. Talk with your health care provider to find out if you are at risk for hepatitis B infection. Hepatitis C Testing is recommended for:  Everyone born from 1945 through 1965.  Anyone with known risk factors for hepatitis C. Sexually transmitted infections (STIs)  Get screened for STIs, including gonorrhea and chlamydia, if: ? You are sexually active and are younger than 39 years of age. ? You are older than 39 years of age and your health care provider tells you that you are at risk for this type of infection. ? Your sexual activity has changed since you were last screened, and you are at increased risk for chlamydia or gonorrhea. Ask your health care provider if   you are at risk.  Ask your health care provider about whether you are at high risk for HIV. Your health care provider may recommend a prescription medicine to help prevent HIV infection. If you choose to take medicine to prevent HIV, you should first get tested for HIV. You should then be tested every 3 months for as long as you are taking the medicine. Pregnancy  If you are about to stop having your period (premenopausal) and  you may become pregnant, seek counseling before you get pregnant.  Take 400 to 800 micrograms (mcg) of folic acid every day if you become pregnant.  Ask for birth control (contraception) if you want to prevent pregnancy. Osteoporosis and menopause Osteoporosis is a disease in which the bones lose minerals and strength with aging. This can result in bone fractures. If you are 65 years old or older, or if you are at risk for osteoporosis and fractures, ask your health care provider if you should:  Be screened for bone loss.  Take a calcium or vitamin D supplement to lower your risk of fractures.  Be given hormone replacement therapy (HRT) to treat symptoms of menopause. Follow these instructions at home: Lifestyle  Do not use any products that contain nicotine or tobacco, such as cigarettes, e-cigarettes, and chewing tobacco. If you need help quitting, ask your health care provider.  Do not use street drugs.  Do not share needles.  Ask your health care provider for help if you need support or information about quitting drugs. Alcohol use  Do not drink alcohol if: ? Your health care provider tells you not to drink. ? You are pregnant, may be pregnant, or are planning to become pregnant.  If you drink alcohol: ? Limit how much you use to 0-1 drink a day. ? Limit intake if you are breastfeeding.  Be aware of how much alcohol is in your drink. In the [place] one drink equals one 12 oz bottle of beer (355 mL), one 5 oz glass of wine (148 mL), or one 1 oz glass of hard liquor (44 mL). General instructions  Schedule regular health, dental, and eye exams.  Stay current with your vaccines.  Tell your health care provider if: ? You often feel depressed. ? You have ever been abused or do not feel safe at home. Summary  Adopting a healthy lifestyle and getting preventive care are important in promoting health and wellness.  Follow your health care provider's instructions about healthy  diet, exercising, and getting tested or screened for diseases.  Follow your health care provider's instructions on monitoring your cholesterol and blood pressure. This information is not intended to replace advice given to you by your health care provider. Make sure you discuss any questions you have with your health care provider. Document Revised: 09/03/2018 Document Reviewed: 09/03/2018 Elsevier Patient Education  2020 [employer]  

## 2019-11-06 ENCOUNTER — Telehealth: Payer: Self-pay

## 2019-11-06 NOTE — Telephone Encounter (Signed)
Patient returned phone call in regards to lab results.  Per Regina's lab result note: Bilirubin elevated, not concerning in the setting of normal liver enzymes. Any family history of gallstones or has she been having any abdominal pain? Kidney function normal. Cholesterol looks great. Blood counts normal.   Patient notified of these results and verbalized understanding.   Patient states she has no personal or family history of gallstones that she is aware of, and she denies having any abdominal pain.  Patient is wondering if there is anything she can or needs to do to help get her bilirubin down to a normal level, or if this is anything she should be too concerned with? Rene Kocher, please advise.

## 2019-11-09 NOTE — Telephone Encounter (Signed)
I wouldn't be concerned in the absence of abdominal pain. We will monitor.

## 2019-11-10 NOTE — Telephone Encounter (Signed)
Left detailed msg on VM per HIPAA  

## 2019-12-25 ENCOUNTER — Encounter: Payer: Self-pay | Admitting: Internal Medicine

## 2020-01-04 ENCOUNTER — Ambulatory Visit: Payer: Self-pay | Attending: Internal Medicine

## 2020-01-12 ENCOUNTER — Encounter: Payer: Self-pay | Admitting: Internal Medicine

## 2020-02-08 ENCOUNTER — Ambulatory Visit (INDEPENDENT_AMBULATORY_CARE_PROVIDER_SITE_OTHER): Payer: 59 | Admitting: Internal Medicine

## 2020-02-08 ENCOUNTER — Other Ambulatory Visit: Payer: Self-pay

## 2020-02-08 ENCOUNTER — Encounter: Payer: Self-pay | Admitting: Internal Medicine

## 2020-02-08 ENCOUNTER — Ambulatory Visit (INDEPENDENT_AMBULATORY_CARE_PROVIDER_SITE_OTHER)
Admission: RE | Admit: 2020-02-08 | Discharge: 2020-02-08 | Disposition: A | Discharge status: Home / Self Care | Payer: 59 | Source: Ambulatory Visit | Attending: Internal Medicine | Admitting: Internal Medicine

## 2020-02-08 VITALS — BP 112/74 | HR 75 | Temp 97.8°F | Wt 161.0 lb

## 2020-02-08 DIAGNOSIS — M25532 Pain in left wrist: Secondary | ICD-10-CM | POA: Diagnosis not present

## 2020-02-08 DIAGNOSIS — M25432 Effusion, left wrist: Secondary | ICD-10-CM

## 2020-02-08 MED ORDER — PREDNISONE 10 MG PO TABS: ORAL_TABLET | ORAL | 0 refills | Status: DC

## 2020-02-08 NOTE — Patient Instructions (Signed)
Carpal Tunnel Syndrome  Carpal tunnel syndrome is a condition that causes pain in your hand and arm. The carpal tunnel is a narrow area that is on the palm side of your wrist. Repeated wrist motion or certain diseases may cause swelling in the tunnel. This swelling can pinch the main nerve in the wrist (median nerve). What are the causes? This condition may be caused by:  Repeated wrist motions.  Wrist injuries.  Arthritis.  A sac of fluid (cyst) or abnormal growth (tumor) in the carpal tunnel.  Fluid buildup during pregnancy. Sometimes the cause is not known. What increases the risk? The following factors may make you more likely to develop this condition:  Having a job in which you move your wrist in the same way many times. This includes jobs like being a butcher or a cashier.  Being a woman.  Having other health conditions, such as: ? Diabetes. ? Obesity. ? A thyroid gland that is not active enough (hypothyroidism). ? Kidney failure. What are the signs or symptoms? Symptoms of this condition include:  A tingling feeling in your fingers.  Tingling or a loss of feeling (numbness) in your hand.  Pain in your entire arm. This pain may get worse when you bend your wrist and elbow for a long time.  Pain in your wrist that goes up your arm to your shoulder.  Pain that goes down into your palm or fingers.  A weak feeling in your hands. You may find it hard to grab and hold items. You may feel worse at night. How is this diagnosed? This condition is diagnosed with a medical history and physical exam. You may also have tests, such as:  Electromyogram (EMG). This test checks the signals that the nerves send to the muscles.  Nerve conduction study. This test checks how well signals pass through your nerves.  Imaging tests, such as X-rays, ultrasound, and MRI. These tests check for what might be the cause of your condition. How is this treated? This condition may be treated  with:  Lifestyle changes. You will be asked to stop or change the activity that caused your problem.  Doing exercise and activities that make bones and muscles stronger (physical therapy).  Learning how to use your hand again (occupational therapy).  Medicines for pain and swelling (inflammation). You may have injections in your wrist.  A wrist splint.  Surgery. Follow these instructions at home: If you have a splint:  Wear the splint as told by your doctor. Remove it only as told by your doctor.  Loosen the splint if your fingers: ? Tingle. ? Lose feeling (become numb). ? Turn cold and blue.  Keep the splint clean.  If the splint is not waterproof: ? Do not let it get wet. ? Cover it with a watertight covering when you take a bath or a shower. Managing pain, stiffness, and swelling   If told, put ice on the painful area: ? If you have a removable splint, remove it as told by your doctor. ? Put ice in a plastic bag. ? Place a towel between your skin and the bag. ? Leave the ice on for 20 minutes, 2-3 times per day. General instructions  Take over-the-counter and prescription medicines only as told by your doctor.  Rest your wrist from any activity that may cause pain. If needed, talk with your boss at work about changes that can help your wrist heal.  Do any exercises as told by your doctor,   physical therapist, or occupational therapist.  Keep all follow-up visits as told by your doctor. This is important. Contact a doctor if:  You have new symptoms.  Medicine does not help your pain.  Your symptoms get worse. Get help right away if:  You have very bad numbness or tingling in your wrist or hand. Summary  Carpal tunnel syndrome is a condition that causes pain in your hand and arm.  It is often caused by repeated wrist motions.  Lifestyle changes and medicines are used to treat this problem. Surgery may help in very bad cases.  Follow your doctor's  instructions about wearing a splint, resting your wrist, keeping follow-up visits, and calling for help. This information is not intended to replace advice given to you by your health care provider. Make sure you discuss any questions you have with your health care provider. Document Revised: 01/17/2018 Document Reviewed: 01/17/2018 Elsevier Patient Education  2020 [employer] Tommi Rumps Quervain's Tenosynovitis  De Quervain's tenosynovitis is a condition that causes inflammation of the tendon on the thumb side of the wrist. Tendons are cords of tissue that connect bones to muscles. The tendons in the hand pass through a tunnel called a sheath. A slippery layer of tissue (synovium) lets the tendons move smoothly in the sheath. With de Quervain's tenosynovitis, the sheath swells or thickens, causing friction and pain. The condition is also called de Quervain's disease and de Quervain's syndrome. It occurs most often in women who are 52-35 years old. What are the causes? The exact cause of this condition is not known. It may be associated with overuse of the hand and wrist. What increases the risk? You are more likely to develop this condition if you:  Use your hands far more than normal, especially if you repeat certain movements that involve twisting your hand or using a tight grip.  Are pregnant.  Are a middle-aged woman.  Have rheumatoid arthritis.  Have diabetes. What are the signs or symptoms? The main symptom of this condition is pain on the thumb side of the wrist. The pain may get worse when you grasp something or turn your wrist. Other symptoms may include:  Pain that extends up the forearm.  Swelling of your wrist and hand.  Trouble moving the thumb and wrist.  A sensation of snapping in the wrist.  A bump filled with fluid (cyst) in the area of the pain. How is this diagnosed? This condition may be diagnosed based on:  Your symptoms and medical history.  A physical exam.  During the exam, your health care provider may do a simple test Lourena Simmonds test) that involves pulling your thumb and wrist to see if this causes pain. You may also need to have an X-ray. How is this treated? Treatment for this condition may include:  Avoiding any activity that causes pain and swelling.  Taking medicines. Anti-inflammatory medicines and corticosteroid injections may be used to reduce inflammation and relieve pain.  Wearing a splint.  Having surgery. This may be needed if other treatments do not work. Once the pain and swelling has gone down:  Physical therapy. This includes stretching and strengthening exercises.  Occupational therapy. This includes adjusting how you move your wrist. Follow these instructions at home: If you have a splint:  Wear the splint as told by your health care provider. Remove it only as told by your health care provider.  Loosen the splint if your fingers tingle, become numb, or turn cold and blue.  Keep  the splint clean.  If the splint is not waterproof: ? Do not let it get wet. ? Cover it with a watertight covering when you take a bath or a shower. Managing pain, stiffness, and swelling   Avoid movements and activities that cause pain and swelling in the wrist area.  If directed, put ice on the painful area. This may be helpful after doing activities that involve the sore wrist. ? Put ice in a plastic bag. ? Place a towel between your skin and the bag. ? Leave the ice on for 20 minutes, 2-3 times a day.  Move your fingers often to avoid stiffness and to lessen swelling.  Raise (elevate) the injured area above the level of your heart while you are sitting or lying down. General instructions  Return to your normal activities as told by your health care provider. Ask your health care provider what activities are safe for you.  Take over-the-counter and prescription medicines only as told by your health care provider.  Keep all  follow-up visits as told by your health care provider. This is important. Contact a health care provider if:  Your pain medicine does not help.  Your pain gets worse.  You develop new symptoms. Summary  De Quervain's tenosynovitis is a condition that causes inflammation of the tendon on the thumb side of the wrist.  The condition occurs most often in women who are 69-64 years old.  The exact cause of this condition is not known. It may be associated with overuse of the hand and wrist.  Treatment starts with avoiding activity that causes pain or swelling in the wrist area. Other treatment may include wearing a splint and taking medicine. Sometimes, surgery is needed. This information is not intended to replace advice given to you by your health care provider. Make sure you discuss any questions you have with your health care provider. Document Revised: 03/13/2018 Document Reviewed: 08/19/2017 Elsevier Patient Education  2020 [employer].

## 2020-02-08 NOTE — Progress Notes (Signed)
Subjective:    Patient ID: Julie Jimenez, female    DOB: 1980/11/01, 39 y.o.   MRN: [id number]  HPI  Pt presents to the clinic today with c/o left wrist pain and swelling. This started 3-4 weeks ago. She describes the pain as soreness. The pain is worse with movement. She reports associated numbness, tingling and swelling. She denies any injury to the area but has been doing more yoga recently. She has tried Ibuprofen with minimal relief.  Review of Systems      Past Medical History:  Diagnosis Date  . Anxiety   . Attention deficit disorder (ADD)   . Breast cyst, right   . Hemorrhoids   . History of shingles 2013  . LGSIL on Pap smear of cervix 02/17/2003    Current Outpatient Medications  Medication Sig Dispense Refill  . Ascorbic Acid (VITAMIN C) 1000 MG tablet Take 1,000 mg by mouth daily.    . Cholecalciferol (VITAMIN D-3) 25 MCG (1000 UT) CAPS Take by mouth.    . ELDERBERRY PO Take by mouth.    . magnesium 30 MG tablet Take 30 mg by mouth 2 (two) times daily.    . Menaquinone-7 (VITAMIN K2 PO) Take by mouth.    . Omega-3 1000 MG CAPS Take by mouth.    . vitamin B-12 (CYANOCOBALAMIN) 1000 MCG tablet Take 1,000 mcg by mouth daily.    . Zinc 30 MG TABS Take by mouth.     No current facility-administered medications for this visit.    Allergies  Allergen Reactions  . Augmentin [Amoxicillin-Pot Clavulanate] Rash  . Codeine Nausea And Vomiting and Rash    Family History  Problem Relation Age of Onset  . Heart disease Paternal Grandmother   . Heart disease Paternal Grandfather     Social History   Socioeconomic History  . Marital status: Single    Spouse name: Not on file  . Number of children: Not on file  . Years of education: Not on file  . Highest education level: Not on file  Occupational History  . Not on file  Tobacco Use  . Smoking status: Current Some Day Smoker    Packs/day: 0.18    Years: 10.00    Pack years: 1.80    Types: Cigarettes  .  Smokeless tobacco: Never Used  Substance and Sexual Activity  . Alcohol use: Yes    Alcohol/week: 8.0 standard drinks    Types: 8 Glasses of wine per week    Comment: moderate  . Drug use: No  . Sexual activity: Yes    Birth control/protection: None  Other Topics Concern  . Not on file  Social History Narrative  . Not on file   Social Determinants of Health   Financial Resource Strain:   . Difficulty of Paying Living Expenses:   Food Insecurity:   . Worried About Programme researcher, broadcasting/film/video in the Last Year:   . Barista in the Last Year:   Transportation Needs:   . Freight forwarder (Medical):   Marland Kitchen Lack of Transportation (Non-Medical):   Physical Activity:   . Days of Exercise per Week:   . Minutes of Exercise per Session:   Stress:   . Feeling of Stress :   Social Connections:   . Frequency of Communication with Friends and Family:   . Frequency of Social Gatherings with Friends and Family:   . Attends Religious Services:   . Active Member of Clubs or  Organizations:   . Attends Banker Meetings:   Marland Kitchen Marital Status:   Intimate Partner Violence:   . Fear of Current or Ex-Partner:   . Emotionally Abused:   Marland Kitchen Physically Abused:   . Sexually Abused:      Constitutional: Denies fever, malaise, fatigue, headache or abrupt weight changes.  Respiratory: Denies difficulty breathing, shortness of breath, cough or sputum production.   Cardiovascular: Denies chest pain, chest tightness, palpitations or swelling in the hands or feet.  Musculoskeletal: Pt reports left wrist pain and swelling. Denies decrease in range of motion, difficulty with gait, muscle pain.  Skin: Denies redness, rashes, lesions or ulcercations.  Neurological: Pt reports numbness and tingling of left hand. Denies dizziness, difficulty with memory, difficulty with speech or problems with balance and coordination.    No other specific complaints in a complete review of systems (except as  listed in HPI above).  Objective:   Physical Exam   BP 112/74   Pulse 75   Temp 97.8 F (36.6 C) (Temporal)   Wt 161 lb (73 kg)   LMP 01/23/2020   SpO2 98%   BMI 23.78 kg/m   Wt Readings from Last 3 Encounters:  11/05/19 160 lb (72.6 kg)  04/30/18 172 lb 8 oz (78.2 kg)  11/29/17 185 lb (83.9 kg)    General: Appears her stated age, well developed, well nourished in NAD. Skin: Warm, dry and intact. No rashes, lesions or ulcerations noted. Cardiovascular: Normal rate and rhythm. S1,S2 noted.  No murmur, rubs or gallops noted. Radial pulse 2+ bilaterally.  Pulmonary/Chest: Normal effort and positive vesicular breath sounds. No respiratory distress. No wheezes, rales or ronchi noted.  Musculoskeletal: Pain with flexion and rotation of the left wrist. Normal extension. No pain with palpation of the left wrist or hand. Swelling noted over the dorsal wrist, 2nd/3rd/4th fingers. Hand grips equal. Neurological: Alert and oriented. Positive Phalen's. Negative Tinel's. Coordination normal.    BMET    Component Value Date/Time   NA 136 11/05/2019 1014   NA 141 05/27/2014 0122   K 4.0 11/05/2019 1014   K 3.5 05/27/2014 0122   CL 105 11/05/2019 1014   CL 107 05/27/2014 0122   CO2 27 11/05/2019 1014   CO2 24 05/27/2014 0122   GLUCOSE 81 11/05/2019 1014   GLUCOSE 96 05/27/2014 0122   BUN 8 11/05/2019 1014   BUN 9 05/27/2014 0122   CREATININE 0.73 11/05/2019 1014   CREATININE 0.70 07/08/2015 1505   CALCIUM 8.8 11/05/2019 1014   CALCIUM 8.9 05/27/2014 0122   GFRNONAA >60 05/27/2014 0122   GFRAA >60 05/27/2014 0122    Lipid Panel     Component Value Date/Time   CHOL 161 11/05/2019 1014   TRIG 33.0 11/05/2019 1014   HDL 58.20 11/05/2019 1014   CHOLHDL 3 11/05/2019 1014   VLDL 6.6 11/05/2019 1014   LDLCALC 96 11/05/2019 1014    CBC    Component Value Date/Time   WBC 7.3 11/05/2019 1014   RBC 4.45 11/05/2019 1014   HGB 14.2 11/05/2019 1014   HGB 16.5 (H) 05/27/2014 0122    HCT 43.3 11/05/2019 1014   HCT 51.0 (H) 05/27/2014 0122   PLT 290.0 11/05/2019 1014   PLT 352 05/27/2014 0122   MCV 97.4 11/05/2019 1014   MCV 96 05/27/2014 0122   MCH 30.7 07/08/2015 1505   MCHC 32.7 11/05/2019 1014   RDW 13.0 11/05/2019 1014   RDW 12.6 05/27/2014 0122   LYMPHSABS 3.8 (H)  05/27/2014 0122   MONOABS 0.7 05/27/2014 0122   EOSABS 0.3 05/27/2014 0122   BASOSABS 0.1 05/27/2014 0122    Hgb A1C Lab Results  Component Value Date   HGBA1C 5.1 07/08/2015          Assessment & Plan:   Left Wrist Pain, Swelling:  DeQuervain's Tenosynovitis vs Carpal Tunnel Xray left wrist today RX for Pred Taper x 6 days Encouraged ice, elevation, compression/brace  Will follow up after xray, return precautions discussed Webb Silversmith, NP This visit occurred during the SARS-CoV-2 public health emergency.  Safety protocols were in place, including screening questions prior to the visit, additional usage of staff PPE, and extensive cleaning of exam room while observing appropriate contact time as indicated for disinfecting solutions.

## 2020-02-18 ENCOUNTER — Encounter: Payer: Self-pay | Admitting: Internal Medicine

## 2020-05-02 ENCOUNTER — Ambulatory Visit (INDEPENDENT_AMBULATORY_CARE_PROVIDER_SITE_OTHER): Payer: Self-pay

## 2020-05-02 ENCOUNTER — Other Ambulatory Visit: Payer: Self-pay

## 2020-05-02 DIAGNOSIS — L680 Hirsutism: Secondary | ICD-10-CM

## 2020-05-02 NOTE — Progress Notes (Signed)
Pt presents today for LHR to her bikini. Risks and benefits reviewed. Pt knows multiple treatments are required spaced by 6-8 weeks apart. She is too tan to be treated today. She will return in the fall for procedure. Lennon Alstrom

## 2020-07-06 ENCOUNTER — Ambulatory Visit (INDEPENDENT_AMBULATORY_CARE_PROVIDER_SITE_OTHER): Payer: 59

## 2020-07-06 ENCOUNTER — Other Ambulatory Visit: Payer: Self-pay

## 2020-07-06 DIAGNOSIS — Z23 Encounter for immunization: Secondary | ICD-10-CM

## 2020-08-23 ENCOUNTER — Other Ambulatory Visit: Payer: Self-pay

## 2020-08-23 ENCOUNTER — Encounter: Payer: Self-pay | Admitting: Obstetrics and Gynecology

## 2020-08-23 ENCOUNTER — Ambulatory Visit (INDEPENDENT_AMBULATORY_CARE_PROVIDER_SITE_OTHER): Payer: 59 | Admitting: Obstetrics and Gynecology

## 2020-08-23 VITALS — BP 120/90 | Ht 70.0 in | Wt 173.0 lb

## 2020-08-23 DIAGNOSIS — Z01419 Encounter for gynecological examination (general) (routine) without abnormal findings: Secondary | ICD-10-CM | POA: Diagnosis not present

## 2020-08-23 DIAGNOSIS — Z3009 Encounter for other general counseling and advice on contraception: Secondary | ICD-10-CM | POA: Diagnosis not present

## 2020-08-23 NOTE — Patient Instructions (Signed)
I value your feedback and entrusting us with your care. If you get a [facility] patient survey, I would appreciate you taking the time to let us know about your experience today. Thank you!  As of September 03, 2019, your lab results will be released to your MyChart immediately, before I even have a chance to see them. Please give me time to review them and contact you if there are any abnormalities. Thank you for your patience.  

## 2020-08-23 NOTE — Progress Notes (Signed)
PCP:  Lorre Munroe, NP   Chief Complaint  Patient presents with  . Gynecologic Exam     HPI:      [name] is a 39 y.o. No obstetric history on file. who LMP was Patient's last menstrual period was 08/15/2020 (exact date)., presents today for her annual examination.  Her menses are regular every 28-30 days, lasting 5 days. Dysmenorrhea none. She does not have intermenstrual bleeding.  Sex activity: single partner, contraception - rhythm method. Considering TL. Doesn't want hormones/IUD. Hx of deep dyspareunia for many yrs, no real change. She uses lubricant prn with ext relief of sx. Last Pap: 10/02/17  Results were: no abnormalities /neg HPV DNA  Hx of STDs: HPV/LGSIL 2004  There is no FH of breast cancer. There is no FH of ovarian cancer. The patient does not do self-breast exams.  Tobacco use: cigs with alcohol use on wknds, has tried nicorette gum in past with improvement Alcohol use: wknds No drug use.  Exercise: very active  She does get adequate calcium and Vitamin D in her diet. Normal labs 2018  Past Medical History:  Diagnosis Date  . Anxiety   . Attention deficit disorder (ADD)   . Breast cyst, right   . Hemorrhoids   . History of shingles 2013  . LGSIL on Pap smear of cervix 02/17/2003    Past Surgical History:  Procedure Laterality Date  . EXTERNAL EAR SURGERY    . TONSILLECTOMY AND ADENOIDECTOMY      Family History  Problem Relation Age of Onset  . Heart disease Paternal Grandmother   . Heart disease Paternal Grandfather     Social History   Socioeconomic History  . Marital status: Single    Spouse name: Not on file  . Number of children: Not on file  . Years of education: Not on file  . Highest education level: Not on file  Occupational History  . Not on file  Tobacco Use  . Smoking status: Current Some Day Smoker    Packs/day: 0.18    Years: 10.00    Pack years: 1.80    Types: Cigarettes  . Smokeless tobacco: Never Used    Vaping Use  . Vaping Use: Never used  Substance and Sexual Activity  . Alcohol use: Yes    Alcohol/week: 8.0 standard drinks    Types: 8 Glasses of wine per week    Comment: moderate  . Drug use: No  . Sexual activity: Yes    Birth control/protection: None  Other Topics Concern  . Not on file  Social History Narrative  . Not on file   Social Determinants of Health   Financial Resource Strain:   . Difficulty of Paying Living Expenses: Not on file  Food Insecurity:   . Worried About Programme researcher, broadcasting/film/video in the Last Year: Not on file  . Ran Out of Food in the Last Year: Not on file  Transportation Needs:   . Lack of Transportation (Medical): Not on file  . Lack of Transportation (Non-Medical): Not on file  Physical Activity:   . Days of Exercise per Week: Not on file  . Minutes of Exercise per Session: Not on file  Stress:   . Feeling of Stress : Not on file  Social Connections:   . Frequency of Communication with Friends and Family: Not on file  . Frequency of Social Gatherings with Friends and Family: Not on file  . Attends Religious Services: Not  on file  . Active Member of Clubs or Organizations: Not on file  . Attends Banker Meetings: Not on file  . Marital Status: Not on file  Intimate Partner Violence:   . Fear of Current or Ex-Partner: Not on file  . Emotionally Abused: Not on file  . Physically Abused: Not on file  . Sexually Abused: Not on file    Current Meds  Medication Sig  . Ascorbic Acid (VITAMIN C) 1000 MG tablet Take 1,000 mg by mouth daily.  . Cholecalciferol (VITAMIN D-3) 25 MCG (1000 UT) CAPS Take by mouth.  . ELDERBERRY PO Take by mouth.  . magnesium 30 MG tablet Take 30 mg by mouth 2 (two) times daily.  . Menaquinone-7 (VITAMIN K2 PO) Take by mouth.  . Omega-3 1000 MG CAPS Take by mouth.  . vitamin B-12 (CYANOCOBALAMIN) 1000 MCG tablet Take 1,000 mcg by mouth daily.  . Zinc 30 MG TABS Take by mouth.     ROS:  Review of  Systems  Constitutional: Negative for fatigue, fever and unexpected weight change.  Respiratory: Negative for cough, shortness of breath and wheezing.   Cardiovascular: Negative for chest pain, palpitations and leg swelling.  Gastrointestinal: Negative for blood in stool, constipation, diarrhea, nausea and vomiting.  Endocrine: Negative for cold intolerance, heat intolerance and polyuria.  Genitourinary: Negative for dyspareunia, dysuria, flank pain, frequency, genital sores, hematuria, menstrual problem, pelvic pain, urgency, vaginal bleeding, vaginal discharge and vaginal pain.  Musculoskeletal: Negative for back pain, joint swelling and myalgias.  Skin: Negative for rash.  Neurological: Negative for dizziness, syncope, light-headedness, numbness and headaches.  Hematological: Negative for adenopathy.  Psychiatric/Behavioral: Negative for agitation, confusion, sleep disturbance and suicidal ideas. The patient is not nervous/anxious.      Objective: BP 120/90   Ht 5\' 10"  (1.778 m)   Wt 173 lb (78.5 kg)   LMP 08/15/2020 (Exact Date)   BMI 24.82 kg/m    Physical Exam Constitutional:      Appearance: She is well-developed.  Genitourinary:     Vulva, vagina, uterus, right adnexa and left adnexa normal.     No vulval lesion or tenderness noted.     No vaginal discharge, erythema or tenderness.     No cervical motion tenderness or polyp.     Uterus is not enlarged or tender.     No right or left adnexal mass present.     Right adnexa not tender.     Left adnexa not tender.  Neck:     Thyroid: No thyromegaly.  Cardiovascular:     Rate and Rhythm: Normal rate and regular rhythm.     Heart sounds: Normal heart sounds. No murmur heard.   Pulmonary:     Effort: Pulmonary effort is normal.     Breath sounds: Normal breath sounds.  Chest:     Breasts:        Right: No mass, nipple discharge, skin change or tenderness.        Left: No mass, nipple discharge, skin change or  tenderness.  Abdominal:     Palpations: Abdomen is soft.     Tenderness: There is no abdominal tenderness. There is no guarding.  Musculoskeletal:        General: Normal range of motion.     Cervical back: Normal range of motion.  Neurological:     General: No focal deficit present.     Mental Status: She is alert and oriented to person, place, and time.  Cranial Nerves: No cranial nerve deficit.  Skin:    General: Skin is warm and dry.  Psychiatric:        Mood and Affect: Mood normal.        Behavior: Behavior normal.        Thought Content: Thought content normal.        Judgment: Judgment normal.  Vitals reviewed.     Assessment/Plan: Encounter for annual routine gynecological examination  Encounter for other general counseling or advice on contraception--discussed TL. Pt to f/u with MD for consultation if desires.   GYN counsel breast self exam, adequate intake of calcium and vitamin D, diet and exercise, tobacco use     F/U  Return in about 1 year (around 08/23/2021).  Wynton Hufstetler B. Ephriam Turman, PA-C 08/23/2020 10:36 AM

## 2020-09-22 ENCOUNTER — Ambulatory Visit: Payer: 59 | Admitting: Family Medicine

## 2020-11-14 ENCOUNTER — Encounter: Payer: 59 | Admitting: Internal Medicine

## 2020-12-12 ENCOUNTER — Other Ambulatory Visit: Payer: Self-pay

## 2020-12-12 ENCOUNTER — Ambulatory Visit (INDEPENDENT_AMBULATORY_CARE_PROVIDER_SITE_OTHER): Payer: 59 | Admitting: Internal Medicine

## 2020-12-12 ENCOUNTER — Encounter: Payer: Self-pay | Admitting: Internal Medicine

## 2020-12-12 VITALS — BP 124/80 | HR 75 | Temp 97.8°F | Ht 69.0 in | Wt 173.0 lb

## 2020-12-12 DIAGNOSIS — F411 Generalized anxiety disorder: Secondary | ICD-10-CM

## 2020-12-12 DIAGNOSIS — Z0000 Encounter for general adult medical examination without abnormal findings: Secondary | ICD-10-CM | POA: Diagnosis not present

## 2020-12-12 DIAGNOSIS — Z5320 Procedure and treatment not carried out because of patient's decision for unspecified reasons: Secondary | ICD-10-CM | POA: Diagnosis not present

## 2020-12-12 DIAGNOSIS — F902 Attention-deficit hyperactivity disorder, combined type: Secondary | ICD-10-CM | POA: Diagnosis not present

## 2020-12-12 LAB — COMPREHENSIVE METABOLIC PANEL
ALT: 12 U/L (ref 0–35)
AST: 19 U/L (ref 0–37)
Albumin: 4.3 g/dL (ref 3.5–5.2)
Alkaline Phosphatase: 39 U/L (ref 39–117)
BUN: 8 mg/dL (ref 6–23)
CO2: 28 mEq/L (ref 19–32)
Calcium: 9 mg/dL (ref 8.4–10.5)
Chloride: 105 mEq/L (ref 96–112)
Creatinine, Ser: 0.81 mg/dL (ref 0.40–1.20)
GFR: 91.23 mL/min (ref >60.00)
Glucose, Bld: 91 mg/dL (ref 70–99)
Potassium: 4.3 mEq/L (ref 3.5–5.1)
Sodium: 139 mEq/L (ref 135–145)
Total Bilirubin: 0.9 mg/dL (ref 0.2–1.2)
Total Protein: 5.9 g/dL — ABNORMAL LOW (ref 6.0–8.3)

## 2020-12-12 LAB — CBC
HCT: 43.1 % (ref 36.0–46.0)
Hemoglobin: 14.4 g/dL (ref 12.0–15.0)
MCHC: 33.4 g/dL (ref 30.0–36.0)
MCV: 95.2 fl (ref 78.0–100.0)
Platelets: 307 10*3/uL (ref 150.0–400.0)
RBC: 4.53 Mil/uL (ref 3.87–5.11)
RDW: 13.2 % (ref 11.5–15.5)
WBC: 8.5 10*3/uL (ref 4.0–10.5)

## 2020-12-12 LAB — LIPID PANEL
Cholesterol: 171 mg/dL (ref 0–200)
HDL: 59.3 mg/dL (ref >39.00)
LDL Cholesterol: 102 mg/dL — ABNORMAL HIGH (ref 0–99)
NonHDL: 111.35
Total CHOL/HDL Ratio: 3
Triglycerides: 47 mg/dL (ref 0.0–149.0)
VLDL: 9.4 mg/dL (ref 0.0–40.0)

## 2020-12-12 LAB — TSH: TSH: 1.61 u[IU]/mL (ref 0.35–4.50)

## 2020-12-12 MED ORDER — ALPRAZOLAM 0.25 MG PO TABS: 0.2500 mg | ORAL_TABLET | Freq: Every day | ORAL | 0 refills | Status: DC | PRN

## 2020-12-12 NOTE — Assessment & Plan Note (Signed)
Deteriorated Support offered today RX for Xanax 0.25 mg PO daily prn- sedation and addiction caution given Will obtain CSA and UDS at next visit

## 2020-12-12 NOTE — Assessment & Plan Note (Signed)
No issues off meds °Will monitor °

## 2020-12-12 NOTE — Patient Instructions (Signed)
Health Maintenance, Female Adopting a healthy lifestyle and getting preventive care are important in promoting health and wellness. Ask your health care provider about:  The right schedule for you to have regular tests and exams.  Things you can do on your own to prevent diseases and keep yourself healthy. What should I know about diet, weight, and exercise? Eat a healthy diet  Eat a diet that includes plenty of vegetables, fruits, low-fat dairy products, and lean protein.  Do not eat a lot of foods that are high in solid fats, added sugars, or sodium.   Maintain a healthy weight Body mass index (BMI) is used to identify weight problems. It estimates body fat based on height and weight. Your health care provider can help determine your BMI and help you achieve or maintain a healthy weight. Get regular exercise Get regular exercise. This is one of the most important things you can do for your health. Most adults should:  Exercise for at least 150 minutes each week. The exercise should increase your heart rate and make you sweat (moderate-intensity exercise).  Do strengthening exercises at least twice a week. This is in addition to the moderate-intensity exercise.  Spend less time sitting. Even light physical activity can be beneficial. Watch cholesterol and blood lipids Have your blood tested for lipids and cholesterol at 40 years of age, then have this test every 5 years. Have your cholesterol levels checked more often if:  Your lipid or cholesterol levels are high.  You are older than 40 years of age.  You are at high risk for heart disease. What should I know about cancer screening? Depending on your health history and family history, you may need to have cancer screening at various ages. This may include screening for:  Breast cancer.  Cervical cancer.  Colorectal cancer.  Skin cancer.  Lung cancer. What should I know about heart disease, diabetes, and high blood  pressure? Blood pressure and heart disease  High blood pressure causes heart disease and increases the risk of stroke. This is more likely to develop in people who have high blood pressure readings, are of African descent, or are overweight.  Have your blood pressure checked: ? Every 3-5 years if you are 18-39 years of age. ? Every year if you are 40 years old or older. Diabetes Have regular diabetes screenings. This checks your fasting blood sugar level. Have the screening done:  Once every three years after age 40 if you are at a normal weight and have a low risk for diabetes.  More often and at a younger age if you are overweight or have a high risk for diabetes. What should I know about preventing infection? Hepatitis B If you have a higher risk for hepatitis B, you should be screened for this virus. Talk with your health care provider to find out if you are at risk for hepatitis B infection. Hepatitis C Testing is recommended for:  Everyone born from 1945 through 1965.  Anyone with known risk factors for hepatitis C. Sexually transmitted infections (STIs)  Get screened for STIs, including gonorrhea and chlamydia, if: ? You are sexually active and are younger than 40 years of age. ? You are older than 40 years of age and your health care provider tells you that you are at risk for this type of infection. ? Your sexual activity has changed since you were last screened, and you are at increased risk for chlamydia or gonorrhea. Ask your health care provider   if you are at risk.  Ask your health care provider about whether you are at high risk for HIV. Your health care provider may recommend a prescription medicine to help prevent HIV infection. If you choose to take medicine to prevent HIV, you should first get tested for HIV. You should then be tested every 3 months for as long as you are taking the medicine. Pregnancy  If you are about to stop having your period (premenopausal) and  you may become pregnant, seek counseling before you get pregnant.  Take 400 to 800 micrograms (mcg) of folic acid every day if you become pregnant.  Ask for birth control (contraception) if you want to prevent pregnancy. Osteoporosis and menopause Osteoporosis is a disease in which the bones lose minerals and strength with aging. This can result in bone fractures. If you are 65 years old or older, or if you are at risk for osteoporosis and fractures, ask your health care provider if you should:  Be screened for bone loss.  Take a calcium or vitamin D supplement to lower your risk of fractures.  Be given hormone replacement therapy (HRT) to treat symptoms of menopause. Follow these instructions at home: Lifestyle  Do not use any products that contain nicotine or tobacco, such as cigarettes, e-cigarettes, and chewing tobacco. If you need help quitting, ask your health care provider.  Do not use street drugs.  Do not share needles.  Ask your health care provider for help if you need support or information about quitting drugs. Alcohol use  Do not drink alcohol if: ? Your health care provider tells you not to drink. ? You are pregnant, may be pregnant, or are planning to become pregnant.  If you drink alcohol: ? Limit how much you use to 0-1 drink a day. ? Limit intake if you are breastfeeding.  Be aware of how much alcohol is in your drink. In the [place] one drink equals one 12 oz bottle of beer (355 mL), one 5 oz glass of wine (148 mL), or one 1 oz glass of hard liquor (44 mL). General instructions  Schedule regular health, dental, and eye exams.  Stay current with your vaccines.  Tell your health care provider if: ? You often feel depressed. ? You have ever been abused or do not feel safe at home. Summary  Adopting a healthy lifestyle and getting preventive care are important in promoting health and wellness.  Follow your health care provider's instructions about healthy  diet, exercising, and getting tested or screened for diseases.  Follow your health care provider's instructions on monitoring your cholesterol and blood pressure. This information is not intended to replace advice given to you by your health care provider. Make sure you discuss any questions you have with your health care provider. Document Revised: 09/03/2018 Document Reviewed: 09/03/2018 Elsevier Patient Education  2021 Elsevier Inc.  

## 2020-12-12 NOTE — Progress Notes (Signed)
Subjective:    Patient ID: Julie Jimenez, female    DOB: 09-30-1980, 40 y.o.   MRN: [id number]  HPI  Pt presents to the clinic today for her annual exam. She is also due to follow up chronic conditions.  ADHD: Currently not an issue. She is not taking any medications for this.   GAD: Intermittent. Worse lately as she is caring for her father with Parkinson's disease. She is not taking any medications for this but has been on Xanax in the past and would like to get restarted on this. She is not currently seeing a therapist. She denies depression, SI/HI.  Flu: 06/2020 Tetanus: 06/2016 Covid: Pfizer x 2 Pap Smear: 09/2017 Dentist: biannually  Diet: She does eat meat. She consumes fruits and veggies. She tries to avoid fried foods. She drinks mostly water, coffee. Exercise: water aerobics, swimming laps, [place]  Review of Systems  Past Medical History:  Diagnosis Date  . Anxiety   . Attention deficit disorder (ADD)   . Breast cyst, right   . Hemorrhoids   . History of shingles 2013  . LGSIL on Pap smear of cervix 02/17/2003    Current Outpatient Medications  Medication Sig Dispense Refill  . Ascorbic Acid (VITAMIN C) 1000 MG tablet Take 1,000 mg by mouth daily.    . Cholecalciferol (VITAMIN D-3) 25 MCG (1000 UT) CAPS Take by mouth.    . ELDERBERRY PO Take by mouth.    . magnesium 30 MG tablet Take 30 mg by mouth 2 (two) times daily.    . Menaquinone-7 (VITAMIN K2 PO) Take by mouth.    . Omega-3 1000 MG CAPS Take by mouth.    . vitamin B-12 (CYANOCOBALAMIN) 1000 MCG tablet Take 1,000 mcg by mouth daily.    . Zinc 30 MG TABS Take by mouth.     No current facility-administered medications for this visit.    Allergies  Allergen Reactions  . Augmentin [Amoxicillin-Pot Clavulanate] Rash  . Codeine Nausea And Vomiting and Rash    Family History  Problem Relation Age of Onset  . Heart disease Paternal Grandmother   . Heart disease Paternal Grandfather     Social  History   Socioeconomic History  . Marital status: Single    Spouse name: Not on file  . Number of children: Not on file  . Years of education: Not on file  . Highest education level: Not on file  Occupational History  . Not on file  Tobacco Use  . Smoking status: Current Some Day Smoker    Packs/day: 0.18    Years: 10.00    Pack years: 1.80    Types: Cigarettes  . Smokeless tobacco: Never Used  Vaping Use  . Vaping Use: Never used  Substance and Sexual Activity  . Alcohol use: Yes    Alcohol/week: 8.0 standard drinks    Types: 8 Glasses of wine per week    Comment: moderate  . Drug use: No  . Sexual activity: Yes    Birth control/protection: None  Other Topics Concern  . Not on file  Social History Narrative  . Not on file   Social Determinants of Health   Financial Resource Strain: Not on file  Food Insecurity: Not on file  Transportation Needs: Not on file  Physical Activity: Not on file  Stress: Not on file  Social Connections: Not on file  Intimate Partner Violence: Not on file     Constitutional: Denies fever, malaise, fatigue, headache or  abrupt weight changes.  HEENT: Denies eye pain, eye redness, ear pain, ringing in the ears, wax buildup, runny nose, nasal congestion, bloody nose, or sore throat. Respiratory: Denies difficulty breathing, shortness of breath, cough or sputum production.   Cardiovascular: Denies chest pain, chest tightness, palpitations or swelling in the hands or feet.  Gastrointestinal: Denies abdominal pain, bloating, constipation, diarrhea or blood in the stool.  GU: Denies urgency, frequency, pain with urination, burning sensation, blood in urine, odor or discharge. Musculoskeletal: Pt reports intermittent right wrist pain. Denies decrease in range of motion, difficulty with gait, muscle pain or joint  swelling.  Skin: Denies redness, rashes, lesions or ulcercations.  Neurological: Pt reports intermittent numbness and tingling in her  right arm (history of pinched nerve). Denies dizziness, difficulty with memory, difficulty with speech or problems with balance and coordination.  Psych: Pt reports intermittent anxiety. Denies depression, SI/HI.  No other specific complaints in a complete review of systems (except as listed in HPI above).     Objective:   Physical Exam  BP 124/80   Pulse 75   Temp 97.8 F (36.6 C) (Temporal)   Ht 5\' 9"  (1.753 m)   Wt 173 lb (78.5 kg)   SpO2 98%   BMI 25.55 kg/m   Wt Readings from Last 3 Encounters:  08/23/20 173 lb (78.5 kg)  02/08/20 161 lb (73 kg)  11/05/19 160 lb (72.6 kg)    General: Appears her stated age, well developed, well nourished in NAD. Skin: Warm, dry and intact. No rashes noted. HEENT: Head: normal shape and size; Eyes: sclera white, no icterus, conjunctiva pink, PERRLA and EOMs intact;  Neck:  Neck supple, trachea midline. No masses, lumps or thyromegaly present.  Cardiovascular: Normal rate and rhythm. S1,S2 noted.  No murmur, rubs or gallops noted. No JVD or BLE edema.  Pulmonary/Chest: Normal effort and positive vesicular breath sounds. No respiratory distress. No wheezes, rales or ronchi noted.  Abdomen: Soft and nontender. Normal bowel sounds. No distention or masses noted. Liver, spleen and kidneys non palpable. Musculoskeletal: Strength 5/5 BUE.BLE. No difficulty with gait.  Neurological: Alert and oriented. Cranial nerves II-XII grossly intact. Coordination normal.  Psychiatric: Mildly anxious appearing. Behavior is normal. Judgment and thought content normal.     BMET    Component Value Date/Time   NA 136 11/05/2019 1014   NA 141 05/27/2014 0122   K 4.0 11/05/2019 1014   K 3.5 05/27/2014 0122   CL 105 11/05/2019 1014   CL 107 05/27/2014 0122   CO2 27 11/05/2019 1014   CO2 24 05/27/2014 0122   GLUCOSE 81 11/05/2019 1014   GLUCOSE 96 05/27/2014 0122   BUN 8 11/05/2019 1014   BUN 9 05/27/2014 0122   CREATININE 0.73 11/05/2019 1014    CREATININE 0.70 07/08/2015 1505   CALCIUM 8.8 11/05/2019 1014   CALCIUM 8.9 05/27/2014 0122   GFRNONAA >60 05/27/2014 0122   GFRAA >60 05/27/2014 0122    Lipid Panel     Component Value Date/Time   CHOL 161 11/05/2019 1014   TRIG 33.0 11/05/2019 1014   HDL 58.20 11/05/2019 1014   CHOLHDL 3 11/05/2019 1014   VLDL 6.6 11/05/2019 1014   LDLCALC 96 11/05/2019 1014    CBC    Component Value Date/Time   WBC 7.3 11/05/2019 1014   RBC 4.45 11/05/2019 1014   HGB 14.2 11/05/2019 1014   HGB 16.5 (H) 05/27/2014 0122   HCT 43.3 11/05/2019 1014   HCT 51.0 (H) 05/27/2014  0122   PLT 290.0 11/05/2019 1014   PLT 352 05/27/2014 0122   MCV 97.4 11/05/2019 1014   MCV 96 05/27/2014 0122   MCH 30.7 07/08/2015 1505   MCHC 32.7 11/05/2019 1014   RDW 13.0 11/05/2019 1014   RDW 12.6 05/27/2014 0122   LYMPHSABS 3.8 (H) 05/27/2014 0122   MONOABS 0.7 05/27/2014 0122   EOSABS 0.3 05/27/2014 0122   BASOSABS 0.1 05/27/2014 0122    Hgb A1C Lab Results  Component Value Date   HGBA1C 5.1 07/08/2015            Assessment & Plan:   Preventative Health Maintenance:  Flu shot UTD Tetanus UTD Covid UTD Pap smear UTD Encouraged her to consume a balanced diet and exercise regimen Advised her to see an eye doctor and dentist annually Will check CBC, CMET, TSH, Lipid and Hep C today  RTC in 1 year, sooner if needed Nicki Reaper, NP This visit occurred during the SARS-CoV-2 public health emergency.  Safety protocols were in place, including screening questions prior to the visit, additional usage of staff PPE, and extensive cleaning of exam room while observing appropriate contact time as indicated for disinfecting solutions.

## 2020-12-13 ENCOUNTER — Encounter: Payer: Self-pay | Admitting: Internal Medicine

## 2020-12-13 LAB — HEPATITIS C ANTIBODY
Hepatitis C Ab: NONREACTIVE
SIGNAL TO CUT-OFF: 0.04 (ref <1.00)

## 2021-04-03 ENCOUNTER — Other Ambulatory Visit: Payer: Self-pay | Admitting: Internal Medicine

## 2021-04-03 MED ORDER — ALBUTEROL SULFATE HFA 108 (90 BASE) MCG/ACT IN AERS: 2.0000 | INHALATION_SPRAY | Freq: Four times a day (QID) | RESPIRATORY_TRACT | 0 refills | Status: DC | PRN

## 2021-04-03 MED ORDER — ALPRAZOLAM 0.25 MG PO TABS: 0.2500 mg | ORAL_TABLET | Freq: Every day | ORAL | 0 refills | Status: DC | PRN

## 2021-04-06 ENCOUNTER — Other Ambulatory Visit: Payer: Self-pay | Admitting: Internal Medicine

## 2021-04-06 MED ORDER — PREDNISONE 10 MG PO TABS: ORAL_TABLET | ORAL | 0 refills | Status: DC

## 2021-06-02 ENCOUNTER — Ambulatory Visit: Payer: Self-pay | Admitting: Internal Medicine

## 2021-08-21 ENCOUNTER — Other Ambulatory Visit: Payer: Self-pay | Admitting: Internal Medicine

## 2021-08-21 MED ORDER — PREDNISONE 10 MG PO TABS: ORAL_TABLET | ORAL | 0 refills | Status: DC

## 2021-08-22 ENCOUNTER — Other Ambulatory Visit: Payer: Self-pay | Admitting: Internal Medicine

## 2021-08-22 MED ORDER — DOXYCYCLINE HYCLATE 100 MG PO TABS
100.0000 mg | ORAL_TABLET | Freq: Two times a day (BID) | ORAL | 0 refills | Status: DC
Start: 2021-08-22 — End: 2021-10-05

## 2021-08-24 ENCOUNTER — Ambulatory Visit: Payer: 59 | Admitting: Obstetrics and Gynecology

## 2021-09-19 ENCOUNTER — Ambulatory Visit: Payer: 59 | Admitting: Obstetrics and Gynecology

## 2021-10-05 ENCOUNTER — Other Ambulatory Visit: Payer: Self-pay

## 2021-10-05 ENCOUNTER — Encounter: Payer: Self-pay | Admitting: Obstetrics and Gynecology

## 2021-10-05 ENCOUNTER — Ambulatory Visit (INDEPENDENT_AMBULATORY_CARE_PROVIDER_SITE_OTHER): Payer: 59 | Admitting: Obstetrics and Gynecology

## 2021-10-05 ENCOUNTER — Other Ambulatory Visit (HOSPITAL_COMMUNITY)
Admission: RE | Admit: 2021-10-05 | Discharge: 2021-10-05 | Disposition: A | Discharge status: Home / Self Care | Payer: Self-pay | Source: Ambulatory Visit | Attending: Obstetrics and Gynecology | Admitting: Obstetrics and Gynecology

## 2021-10-05 VITALS — BP 110/70 | Ht 70.0 in | Wt 174.0 lb

## 2021-10-05 DIAGNOSIS — R69 Illness, unspecified: Secondary | ICD-10-CM | POA: Diagnosis not present

## 2021-10-05 DIAGNOSIS — Z124 Encounter for screening for malignant neoplasm of cervix: Secondary | ICD-10-CM

## 2021-10-05 DIAGNOSIS — Z01419 Encounter for gynecological examination (general) (routine) without abnormal findings: Secondary | ICD-10-CM | POA: Diagnosis not present

## 2021-10-05 DIAGNOSIS — Z1231 Encounter for screening mammogram for malignant neoplasm of breast: Secondary | ICD-10-CM | POA: Diagnosis not present

## 2021-10-05 DIAGNOSIS — Z1151 Encounter for screening for human papillomavirus (HPV): Secondary | ICD-10-CM | POA: Insufficient documentation

## 2021-10-05 NOTE — Patient Instructions (Signed)
I value your feedback and you entrusting us with your care. If you get a [facility] patient survey, I would appreciate you taking the time to let us know about your experience today. Thank you!  [facility]: [phone]  [facility]: [phone]    

## 2021-10-05 NOTE — Progress Notes (Signed)
PCP:  Lorre Munroe, NP   Chief Complaint  Patient presents with   Gynecologic Exam    No concerns     HPI:      [name] is a 41 y.o. No obstetric history on file. who LMP was No LMP recorded (lmp unknown)., presents today for her annual examination.  Her menses are regular every 28-30 days, lasting 5 days. Dysmenorrhea none. She does not have intermenstrual bleeding.  Sex activity: single partner, contraception - rhythm method. Considered TL in past. Doesn't want hormones/IUD. Hx of deep dyspareunia for many yrs, sx resolved.  Last Pap: 10/02/17  Results were: no abnormalities /neg HPV DNA  Hx of STDs: HPV/LGSIL 2004  There is no FH of breast cancer. There is no FH of ovarian cancer. The patient does self-breast exams. Hx of stable RT breast cyst under nipple. Had RT breast u/s at [facility] 2007 and 2008. No longer tender for pt. No change in size.   Tobacco use: 3-10 cigs daily, trying to quit. May want to do wellbutrin with PCP Alcohol use: ~6-8 drinks wkly, cutting down No drug use.  Exercise: very active  She does get adequate calcium and Vitamin D in her diet. Labs with PCP  Past Medical History:  Diagnosis Date   Anxiety    Attention deficit disorder (ADD)    Breast cyst, right    Hemorrhoids    History of shingles 2013   LGSIL on Pap smear of cervix 02/17/2003    Past Surgical History:  Procedure Laterality Date   EXTERNAL EAR SURGERY     TONSILLECTOMY AND ADENOIDECTOMY      Family History  Problem Relation Age of Onset   Heart disease Paternal Grandmother    Heart disease Paternal Grandfather     Social History   Socioeconomic History   Marital status: Single    Spouse name: Not on file   Number of children: Not on file   Years of education: Not on file   Highest education level: Not on file  Occupational History   Not on file  Tobacco Use   Smoking status: Some Days    Packs/day: 0.25    Years: 10.00    Pack years: 2.50    Types:  Cigarettes   Smokeless tobacco: Never  Vaping Use   Vaping Use: Never used  Substance and Sexual Activity   Alcohol use: Yes    Alcohol/week: 8.0 standard drinks    Types: 8 Glasses of wine per week    Comment: moderate   Drug use: No   Sexual activity: Yes    Birth control/protection: None  Other Topics Concern   Not on file  Social History Narrative   Not on file   Social Determinants of Health   Financial Resource Strain: Not on file  Food Insecurity: Not on file  Transportation Needs: Not on file  Physical Activity: Not on file  Stress: Not on file  Social Connections: Not on file  Intimate Partner Violence: Not on file    Current Meds  Medication Sig   albuterol (VENTOLIN HFA) 108 (90 Base) MCG/ACT inhaler Inhale 2 puffs into the lungs every 6 (six) hours as needed for wheezing or shortness of breath.   ALPRAZolam (XANAX) 0.25 MG tablet Take 1 tablet (0.25 mg total) by mouth daily as needed for anxiety.   Ascorbic Acid (VITAMIN C) 1000 MG tablet Take 1,000 mg by mouth daily.   Cholecalciferol (VITAMIN D-3) 25 MCG (  1000 UT) CAPS Take by mouth.   ELDERBERRY PO Take by mouth.   magnesium 30 MG tablet Take 30 mg by mouth 2 (two) times daily.   Menaquinone-7 (VITAMIN K2 PO) Take by mouth.   Omega-3 1000 MG CAPS Take by mouth.   vitamin B-12 (CYANOCOBALAMIN) 1000 MCG tablet Take 1,000 mcg by mouth daily.   Zinc 30 MG TABS Take by mouth.     ROS:  Review of Systems  Constitutional:  Negative for fatigue, fever and unexpected weight change.  Respiratory:  Negative for cough, shortness of breath and wheezing.   Cardiovascular:  Negative for chest pain, palpitations and leg swelling.  Gastrointestinal:  Negative for blood in stool, constipation, diarrhea, nausea and vomiting.  Endocrine: Negative for cold intolerance, heat intolerance and polyuria.  Genitourinary:  Negative for dyspareunia, dysuria, flank pain, frequency, genital sores, hematuria, menstrual problem,  pelvic pain, urgency, vaginal bleeding, vaginal discharge and vaginal pain.  Musculoskeletal:  Negative for back pain, joint swelling and myalgias.  Skin:  Negative for rash.  Neurological:  Negative for dizziness, syncope, light-headedness, numbness and headaches.  Hematological:  Negative for adenopathy.  Psychiatric/Behavioral:  Negative for agitation, confusion, sleep disturbance and suicidal ideas. The patient is not nervous/anxious.     Objective: BP 110/70    Ht 5\' 10"  (1.778 m)    Wt 174 lb (78.9 kg)    LMP  (LMP Unknown)    BMI 24.97 kg/m    Physical Exam Constitutional:      Appearance: She is well-developed.  Genitourinary:     Vulva normal.     Right Labia: No rash, tenderness or lesions.    Left Labia: No tenderness, lesions or rash.    No vaginal discharge, erythema or tenderness.      Right Adnexa: not tender and no mass present.    Left Adnexa: not tender and no mass present.    No cervical motion tenderness, friability or polyp.     Uterus is not enlarged or tender.  Breasts:    Right: No mass, nipple discharge, skin change or tenderness.     Left: No mass, nipple discharge, skin change or tenderness.  Neck:     Thyroid: No thyromegaly.  Cardiovascular:     Rate and Rhythm: Normal rate and regular rhythm.     Heart sounds: Normal heart sounds. No murmur heard. Pulmonary:     Effort: Pulmonary effort is normal.     Breath sounds: Normal breath sounds.  Chest:    Abdominal:     Palpations: Abdomen is soft.     Tenderness: There is no abdominal tenderness. There is no guarding or rebound.  Musculoskeletal:        General: Normal range of motion.     Cervical back: Normal range of motion.  Lymphadenopathy:     Cervical: No cervical adenopathy.  Neurological:     General: No focal deficit present.     Mental Status: She is alert and oriented to person, place, and time.     Cranial Nerves: No cranial nerve deficit.  Skin:    General: Skin is warm and  dry.  Psychiatric:        Mood and Affect: Mood normal.        Behavior: Behavior normal.        Thought Content: Thought content normal.        Judgment: Judgment normal.  Vitals reviewed.    Assessment/Plan: Encounter for annual routine gynecological examination  Cervical  cancer screening - Plan: Cytology - PAP  Screening for HPV (human papillomavirus) - Plan: Cytology - PAP  Encounter for screening mammogram for malignant neoplasm of breast - Plan: MM 3D SCREEN BREAST BILATERAL; pt to [facility]. Hx of stable RT breast mass.   GYN counsel breast self exam, adequate intake of calcium and vitamin D, diet and exercise, tobacco cessation     F/U  Return in about 1 year (around 10/05/2022).  Ashyr Hedgepath B. Cleota Pellerito, PA-C 10/05/2021 1:49 PM

## 2021-10-09 ENCOUNTER — Encounter: Payer: Self-pay | Admitting: Obstetrics and Gynecology

## 2021-10-09 LAB — CYTOLOGY - PAP
Comment: NEGATIVE
Diagnosis: NEGATIVE
High risk HPV: NEGATIVE

## 2021-10-17 DIAGNOSIS — D1801 Hemangioma of skin and subcutaneous tissue: Secondary | ICD-10-CM | POA: Diagnosis not present

## 2021-11-14 ENCOUNTER — Ambulatory Visit
Admission: RE | Admit: 2021-11-14 | Discharge: 2021-11-14 | Disposition: A | Discharge status: Home / Self Care | Payer: 59 | Source: Ambulatory Visit | Attending: Obstetrics and Gynecology | Admitting: Obstetrics and Gynecology

## 2021-11-14 ENCOUNTER — Other Ambulatory Visit: Payer: Self-pay

## 2021-11-14 ENCOUNTER — Encounter: Payer: Self-pay | Admitting: Obstetrics and Gynecology

## 2021-11-14 DIAGNOSIS — Z1231 Encounter for screening mammogram for malignant neoplasm of breast: Secondary | ICD-10-CM | POA: Insufficient documentation

## 2021-11-16 ENCOUNTER — Other Ambulatory Visit: Payer: Self-pay | Admitting: Obstetrics and Gynecology

## 2021-11-16 DIAGNOSIS — N6489 Other specified disorders of breast: Secondary | ICD-10-CM

## 2021-11-16 DIAGNOSIS — R928 Other abnormal and inconclusive findings on diagnostic imaging of breast: Secondary | ICD-10-CM

## 2021-12-07 ENCOUNTER — Ambulatory Visit
Admission: RE | Admit: 2021-12-07 | Discharge: 2021-12-07 | Disposition: A | Discharge status: Home / Self Care | Payer: 59 | Source: Ambulatory Visit | Attending: Obstetrics and Gynecology | Admitting: Obstetrics and Gynecology

## 2021-12-07 DIAGNOSIS — R928 Other abnormal and inconclusive findings on diagnostic imaging of breast: Secondary | ICD-10-CM | POA: Insufficient documentation

## 2021-12-07 DIAGNOSIS — R922 Inconclusive mammogram: Secondary | ICD-10-CM | POA: Diagnosis not present

## 2021-12-07 DIAGNOSIS — N6489 Other specified disorders of breast: Secondary | ICD-10-CM | POA: Insufficient documentation

## 2021-12-14 ENCOUNTER — Encounter: Payer: Self-pay | Admitting: Internal Medicine

## 2021-12-14 ENCOUNTER — Ambulatory Visit (INDEPENDENT_AMBULATORY_CARE_PROVIDER_SITE_OTHER): Payer: 59 | Admitting: Internal Medicine

## 2021-12-14 ENCOUNTER — Other Ambulatory Visit: Payer: Self-pay

## 2021-12-14 VITALS — BP 114/68 | HR 88 | Temp 97.3°F | Ht 70.0 in | Wt 168.0 lb

## 2021-12-14 DIAGNOSIS — F411 Generalized anxiety disorder: Secondary | ICD-10-CM

## 2021-12-14 DIAGNOSIS — F902 Attention-deficit hyperactivity disorder, combined type: Secondary | ICD-10-CM

## 2021-12-14 DIAGNOSIS — F17200 Nicotine dependence, unspecified, uncomplicated: Secondary | ICD-10-CM | POA: Diagnosis not present

## 2021-12-14 DIAGNOSIS — Z0001 Encounter for general adult medical examination with abnormal findings: Secondary | ICD-10-CM

## 2021-12-14 DIAGNOSIS — R1012 Left upper quadrant pain: Secondary | ICD-10-CM

## 2021-12-14 DIAGNOSIS — F5104 Psychophysiologic insomnia: Secondary | ICD-10-CM

## 2021-12-14 DIAGNOSIS — R69 Illness, unspecified: Secondary | ICD-10-CM | POA: Diagnosis not present

## 2021-12-14 DIAGNOSIS — G4700 Insomnia, unspecified: Secondary | ICD-10-CM | POA: Insufficient documentation

## 2021-12-14 MED ORDER — BUPROPION HCL ER (XL) 150 MG PO TB24: ORAL_TABLET | ORAL | 0 refills | Status: DC

## 2021-12-14 NOTE — Assessment & Plan Note (Signed)
Will trial Wellbutrin- also for smoking cessation ?Continue Xanax as needed ?Support offered ?

## 2021-12-14 NOTE — Assessment & Plan Note (Signed)
Consider trial of Trazadone if Wellbutrin ineffective ?

## 2021-12-14 NOTE — Patient Instructions (Signed)

## 2021-12-14 NOTE — Progress Notes (Signed)
? ?Subjective:  ? ? Patient ID: Julie Jimenez, female    DOB: [birth year], 41 y.o.   MRN: [id number] ? ?HPI ? ?Patient presents to clinic today for annual exam.  She is also due to follow-up chronic conditions. ? ?ADHD: Chronic but not medicated.  She is not currently seeing a psychiatrist. ? ?Anxiety: Generalized.  She takes Xanax as needed for relief of symptoms.  She is not seeing a therapist.  She denies depression, SI/HI. ? ?Insomnia: She is able to fall asleep but she is unable to stay asleep. She is not currently taking anything OTC for this. ? ?Flu: 06/2021 ?Tetanus: 06/2016 ?COVID: Pfizer x3 ?Pap smear: 09/2021 ?Mammogram: 11/2021 ?Vision screening: as needed ?Dentist: biannually ? ?Diet: She does eat meat. She consumes fruits and veggies. She tries to avoid fried foods. She drinks mostly water, coffee and alcohol. ?Exercise: Spin, yoga, pilates, boot camp ? ?Review of Systems ? ?   ?Past Medical History:  ?Diagnosis Date  ? Anxiety   ? Attention deficit disorder (ADD)   ? Breast cyst, right   ? Hemorrhoids   ? History of shingles 2013  ? LGSIL on Pap smear of cervix 02/17/2003  ? ? ?Current Outpatient Medications  ?Medication Sig Dispense Refill  ? albuterol (VENTOLIN HFA) 108 (90 Base) MCG/ACT inhaler Inhale 2 puffs into the lungs every 6 (six) hours as needed for wheezing or shortness of breath. 8 g 0  ? ALPRAZolam (XANAX) 0.25 MG tablet Take 1 tablet (0.25 mg total) by mouth daily as needed for anxiety. 15 tablet 0  ? Ascorbic Acid (VITAMIN C) 1000 MG tablet Take 1,000 mg by mouth daily.    ? Cholecalciferol (VITAMIN D-3) 25 MCG (1000 UT) CAPS Take by mouth.    ? ELDERBERRY PO Take by mouth.    ? magnesium 30 MG tablet Take 30 mg by mouth 2 (two) times daily.    ? Menaquinone-7 (VITAMIN K2 PO) Take by mouth.    ? Omega-3 1000 MG CAPS Take by mouth.    ? vitamin B-12 (CYANOCOBALAMIN) 1000 MCG tablet Take 1,000 mcg by mouth daily.    ? Zinc 30 MG TABS Take by mouth.    ? ?No current facility-administered  medications for this visit.  ? ? ?Allergies  ?Allergen Reactions  ? Augmentin [Amoxicillin-Pot Clavulanate] Rash  ? Codeine Nausea And Vomiting and Rash  ? ? ?Family History  ?Problem Relation Age of Onset  ? Parkinson's disease Father   ? Heart disease Paternal Grandmother   ? Heart disease Paternal Grandfather   ? Breast cancer Neg Hx   ? Colon cancer Neg Hx   ? ? ?Social History  ? ?Socioeconomic History  ? Marital status: Single  ?  Spouse name: Not on file  ? Number of children: Not on file  ? Years of education: Not on file  ? Highest education level: Not on file  ?Occupational History  ? Not on file  ?Tobacco Use  ? Smoking status: Some Days  ?  Packs/day: 0.25  ?  Years: 10.00  ?  Pack years: 2.50  ?  Types: Cigarettes  ? Smokeless tobacco: Never  ?Vaping Use  ? Vaping Use: Never used  ?Substance and Sexual Activity  ? Alcohol use: Yes  ?  Alcohol/week: 8.0 standard drinks  ?  Types: 8 Glasses of wine per week  ?  Comment: moderate  ? Drug use: No  ? Sexual activity: Yes  ?  Birth control/protection: None  ?Other  Topics Concern  ? Not on file  ?Social History Narrative  ? Not on file  ? ?Social Determinants of Health  ? ?Financial Resource Strain: Not on file  ?Food Insecurity: Not on file  ?Transportation Needs: Not on file  ?Physical Activity: Not on file  ?Stress: Not on file  ?Social Connections: Not on file  ?Intimate Partner Violence: Not on file  ? ? ? ?Constitutional: Denies fever, malaise, fatigue, headache or abrupt weight changes.  ?HEENT: Denies eye pain, eye redness, ear pain, ringing in the ears, wax buildup, runny nose, nasal congestion, bloody nose, or sore throat. ?Respiratory: Denies difficulty breathing, shortness of breath, cough or sputum production.   ?Cardiovascular: Denies chest pain, chest tightness, palpitations or swelling in the hands or feet.  ?Gastrointestinal: Pt reports left upper quadrant pain. Denies bloating, constipation, diarrhea or blood in the stool.  ?GU: Denies  urgency, frequency, pain with urination, burning sensation, blood in urine, odor or discharge. ?Musculoskeletal: Denies decrease in range of motion, difficulty with gait, muscle pain or joint pain and swelling.  ?Skin: Denies redness, rashes, lesions or ulcercations.  ?Neurological: Patient reports inattention.  Denies dizziness, difficulty with memory, difficulty with speech or problems with balance and coordination.  ?Psych: Patient has a history of anxiety.  Denies depression, SI/HI. ? ?No other specific complaints in a complete review of systems (except as listed in HPI above). ? ?Objective:  ? Physical Exam ? ?BP 114/68 (BP Location: Right Arm, Patient Position: Sitting, Cuff Size: Large)   Pulse 88   Temp (!) 97.3 ?F (36.3 ?C) (Temporal)   Ht _0  (1.778 m)   Wt 168 lb (76.2 kg)   SpO2 100%   BMI 24.11 kg/m?  ?Wt Readings from Last 3 Encounters:  ?12/14/21 168 lb (76.2 kg)  ?10/05/21 174 lb (78.9 kg)  ?12/12/20 173 lb (78.5 kg)  ? ? ?General: Appears her stated age, well developed, well nourished in NAD. ?Skin: Warm, dry and intact.  ?HEENT: Head: normal shape and size; Eyes: sclera white, no icterus, conjunctiva pink, PERRLA and EOMs intact;  ?Neck:  Neck supple, trachea midline. No masses, lumps or thyromegaly present.  ?Cardiovascular: Normal rate and rhythm. S1,S2 noted.  No murmur, rubs or gallops noted. No JVD or BLE edema.  ?Pulmonary/Chest: Normal effort and positive vesicular breath sounds. No respiratory distress. No wheezes, rales or ronchi noted.  ?Abdomen: Soft and nontender. Normal bowel sounds. No distention or masses noted. Liver, spleen and kidneys non palpable. ?Musculoskeletal:Strength 5/5 BUE/BLE. No difficulty with gait.  ?Neurological: Alert and oriented. Cranial nerves II-XII grossly intact. Coordination normal.  ?Psychiatric: Mood and affect normal. Mildly anxious appearing. Judgment and thought content normal.  ? ? ? ?BMET ?   ?Component Value Date/Time  ? NA 139 12/12/2020  1047  ? NA 141 05/27/2014 0122  ? K 4.3 12/12/2020 1047  ? K 3.5 05/27/2014 0122  ? CL 105 12/12/2020 1047  ? CL 107 05/27/2014 0122  ? CO2 28 12/12/2020 1047  ? CO2 24 05/27/2014 0122  ? GLUCOSE 91 12/12/2020 1047  ? GLUCOSE 96 05/27/2014 0122  ? BUN 8 12/12/2020 1047  ? BUN 9 05/27/2014 0122  ? CREATININE 0.81 12/12/2020 1047  ? CREATININE 0.70 07/08/2015 1505  ? CALCIUM 9.0 12/12/2020 1047  ? CALCIUM 8.9 05/27/2014 0122  ? GFRNONAA >60 05/27/2014 0122  ? GFRAA >60 05/27/2014 0122  ? ? ?Lipid Panel  ?   ?Component Value Date/Time  ? CHOL 171 12/12/2020 1047  ? TRIG  47.0 12/12/2020 1047  ? HDL 59.30 12/12/2020 1047  ? CHOLHDL 3 12/12/2020 1047  ? VLDL 9.4 12/12/2020 1047  ? LDLCALC 102 (H) 12/12/2020 1047  ? ? ?CBC ?   ?Component Value Date/Time  ? WBC 8.5 12/12/2020 1047  ? RBC 4.53 12/12/2020 1047  ? HGB 14.4 12/12/2020 1047  ? HGB 16.5 (H) 05/27/2014 0122  ? HCT 43.1 12/12/2020 1047  ? HCT 51.0 (H) 05/27/2014 0122  ? PLT 307.0 12/12/2020 1047  ? PLT 352 05/27/2014 0122  ? MCV 95.2 12/12/2020 1047  ? MCV 96 05/27/2014 0122  ? MCH 30.7 07/08/2015 1505  ? MCHC 33.4 12/12/2020 1047  ? RDW 13.2 12/12/2020 1047  ? RDW 12.6 05/27/2014 0122  ? LYMPHSABS 3.8 (H) 05/27/2014 0122  ? MONOABS 0.7 05/27/2014 0122  ? EOSABS 0.3 05/27/2014 0122  ? BASOSABS 0.1 05/27/2014 0122  ? ? ?Hgb A1C ?Lab Results  ?Component Value Date  ? HGBA1C 5.1 07/08/2015  ? ? ? ? ? ? ? ?   ?Assessment & Plan:  ? ?Preventative Health Maintenance: ? ?Encouraged her to get a flu shot in the fall ?Tetanus UTD ?Encouraged her to get a COVID booster ?Pap smear UTD ?Mammogram UTD ?Encouraged her to consume a balanced diet and exercise regimen ?Advised her to see an eye doctor and dentist annually ?We will check CBC, c-Met and lipid profile today ? ?LUQ Pain: ? ?Will check amylase, lipase today ? ?RTC in 1 year, sooner if needed ?Kizzie Furnish, CMA ?This visit occurred during the SARS-CoV-2 public health emergency.  Safety protocols were in place, including  screening questions prior to the visit, additional usage of staff PPE, and extensive cleaning of exam room while observing appropriate contact time as indicated for disinfecting solutions.  ? ? ?

## 2021-12-14 NOTE — Assessment & Plan Note (Signed)
No issues off meds at this time ?

## 2021-12-15 LAB — LIPID PANEL
Cholesterol: 188 mg/dL (ref <200)
HDL: 62 mg/dL (ref >=50)
LDL Cholesterol (Calc): 114 mg/dL (calc) — ABNORMAL HIGH
Non-HDL Cholesterol (Calc): 126 mg/dL (calc) (ref <130)
Total CHOL/HDL Ratio: 3 (calc) (ref <5.0)
Triglycerides: 40 mg/dL (ref <150)

## 2021-12-15 LAB — COMPLETE METABOLIC PANEL WITH GFR
AG Ratio: 2.9 (calc) — ABNORMAL HIGH (ref 1.0–2.5)
ALT: 15 U/L (ref 6–29)
AST: 22 U/L (ref 10–30)
Albumin: 4.4 g/dL (ref 3.6–5.1)
Alkaline phosphatase (APISO): 40 U/L (ref 31–125)
BUN: 8 mg/dL (ref 7–25)
CO2: 25 mmol/L (ref 20–32)
Calcium: 9.2 mg/dL (ref 8.6–10.2)
Chloride: 105 mmol/L (ref 98–110)
Creat: 0.8 mg/dL (ref 0.50–0.99)
Globulin: 1.5 g/dL (calc) — ABNORMAL LOW (ref 1.9–3.7)
Glucose, Bld: 80 mg/dL (ref 65–99)
Potassium: 4.1 mmol/L (ref 3.5–5.3)
Sodium: 139 mmol/L (ref 135–146)
Total Bilirubin: 1.7 mg/dL — ABNORMAL HIGH (ref 0.2–1.2)
Total Protein: 5.9 g/dL — ABNORMAL LOW (ref 6.1–8.1)
eGFR: 95 mL/min/{1.73_m2} (ref >=60)

## 2021-12-15 LAB — CBC
HCT: 45 % (ref 35.0–45.0)
Hemoglobin: 15 g/dL (ref 11.7–15.5)
MCH: 31.7 pg (ref 27.0–33.0)
MCHC: 33.3 g/dL (ref 32.0–36.0)
MCV: 95.1 fL (ref 80.0–100.0)
MPV: 9.7 fL (ref 7.5–12.5)
Platelets: 313 10*3/uL (ref 140–400)
RBC: 4.73 10*6/uL (ref 3.80–5.10)
RDW: 11.8 % (ref 11.0–15.0)
WBC: 9.2 10*3/uL (ref 3.8–10.8)

## 2021-12-15 LAB — AMYLASE: Amylase: 41 U/L (ref 21–101)

## 2021-12-15 LAB — LIPASE: Lipase: 13 U/L (ref 7–60)

## 2021-12-25 ENCOUNTER — Encounter: Payer: Self-pay | Admitting: Internal Medicine

## 2021-12-26 MED ORDER — BUPROPION HCL 75 MG PO TABS: 75.0000 mg | ORAL_TABLET | Freq: Every day | ORAL | 2 refills | Status: DC

## 2022-02-20 ENCOUNTER — Encounter: Payer: Self-pay | Admitting: Internal Medicine

## 2022-02-20 MED ORDER — ALPRAZOLAM 0.5 MG PO TABS
0.5000 mg | ORAL_TABLET | Freq: Every day | ORAL | 0 refills | Status: DC | PRN
Start: 2022-02-20 — End: 2022-06-18

## 2022-04-07 ENCOUNTER — Encounter: Payer: Self-pay | Admitting: Internal Medicine

## 2022-04-09 NOTE — Telephone Encounter (Signed)
Patient was called by this provider and issues discussed over the phone.  All questions answered.

## 2022-04-25 ENCOUNTER — Other Ambulatory Visit: Payer: Self-pay | Admitting: Internal Medicine

## 2022-04-25 MED ORDER — BUPROPION HCL 75 MG PO TABS: 75.0000 mg | ORAL_TABLET | Freq: Two times a day (BID) | ORAL | 0 refills | Status: DC

## 2022-05-09 DIAGNOSIS — B001 Herpesviral vesicular dermatitis: Secondary | ICD-10-CM | POA: Diagnosis not present

## 2022-05-09 DIAGNOSIS — L309 Dermatitis, unspecified: Secondary | ICD-10-CM | POA: Diagnosis not present

## 2022-05-13 ENCOUNTER — Encounter: Payer: Self-pay | Admitting: Internal Medicine

## 2022-06-18 ENCOUNTER — Other Ambulatory Visit: Payer: Self-pay | Admitting: Internal Medicine

## 2022-06-18 MED ORDER — ALPRAZOLAM 0.5 MG PO TABS: 0.5000 mg | ORAL_TABLET | Freq: Every day | ORAL | 0 refills | Status: DC | PRN

## 2022-07-11 DIAGNOSIS — D2261 Melanocytic nevi of right upper limb, including shoulder: Secondary | ICD-10-CM | POA: Diagnosis not present

## 2022-07-11 DIAGNOSIS — D2272 Melanocytic nevi of left lower limb, including hip: Secondary | ICD-10-CM | POA: Diagnosis not present

## 2022-07-11 DIAGNOSIS — L814 Other melanin hyperpigmentation: Secondary | ICD-10-CM | POA: Diagnosis not present

## 2022-07-11 DIAGNOSIS — D2262 Melanocytic nevi of left upper limb, including shoulder: Secondary | ICD-10-CM | POA: Diagnosis not present

## 2022-07-11 DIAGNOSIS — B001 Herpesviral vesicular dermatitis: Secondary | ICD-10-CM | POA: Diagnosis not present

## 2022-07-11 DIAGNOSIS — D2271 Melanocytic nevi of right lower limb, including hip: Secondary | ICD-10-CM | POA: Diagnosis not present

## 2022-07-11 DIAGNOSIS — X32XXXA Exposure to sunlight, initial encounter: Secondary | ICD-10-CM | POA: Diagnosis not present

## 2022-07-11 DIAGNOSIS — L821 Other seborrheic keratosis: Secondary | ICD-10-CM | POA: Diagnosis not present

## 2022-07-26 ENCOUNTER — Other Ambulatory Visit: Payer: Self-pay | Admitting: Internal Medicine

## 2022-07-26 MED ORDER — ALPRAZOLAM 0.5 MG PO TABS
0.5000 mg | ORAL_TABLET | Freq: Every day | ORAL | 0 refills | Status: DC | PRN
Start: 2022-07-26 — End: 2022-09-03

## 2022-09-03 ENCOUNTER — Other Ambulatory Visit: Payer: Self-pay | Admitting: Internal Medicine

## 2022-09-03 MED ORDER — ALPRAZOLAM 0.5 MG PO TABS
0.5000 mg | ORAL_TABLET | Freq: Every day | ORAL | 0 refills | Status: DC | PRN
Start: 2022-09-03 — End: 2022-10-03

## 2022-09-09 IMAGING — MG MM DIGITAL DIAGNOSTIC UNILAT*R* W/ TOMO W/ CAD
8 series · 8 of 24 positions shown · non-contrast
Comparison: Screening exam, baseline study, 11/14/2021.

CLINICAL DATA: Recall from screening for a possible right breast
asymmetry.

EXAM:
DIGITAL DIAGNOSTIC UNILATERAL RIGHT MAMMOGRAM WITH TOMOSYNTHESIS AND
CAD
TECHNIQUE: Right digital diagnostic mammography and breast tomosynthesis was
performed. The images were evaluated with computer-aided detection.

[R ML synth-2D]
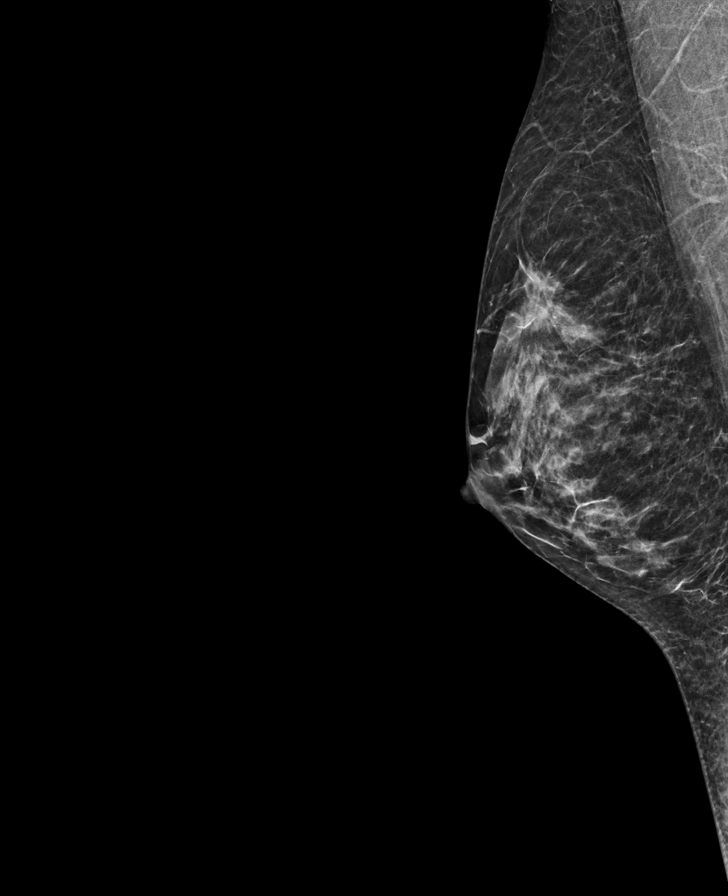

[R XCCL synth-2D]
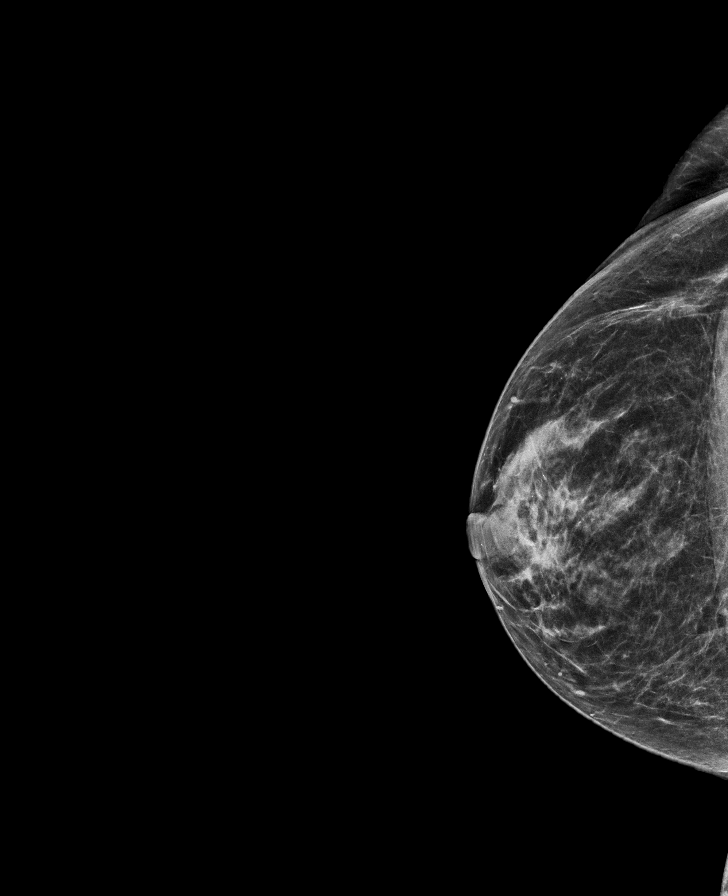

[R MLO synth-2D]
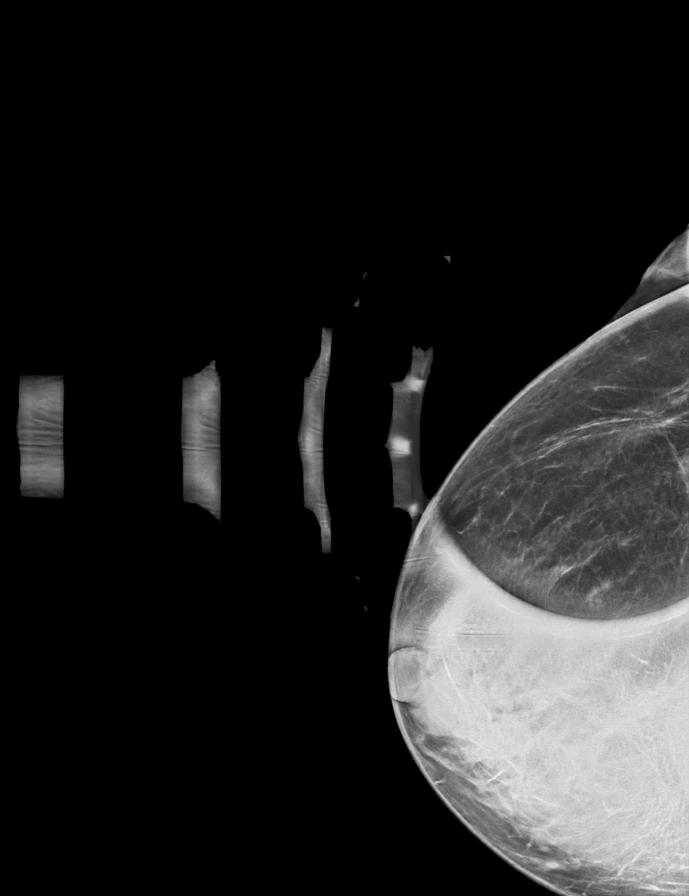

[R CC synth-2D]
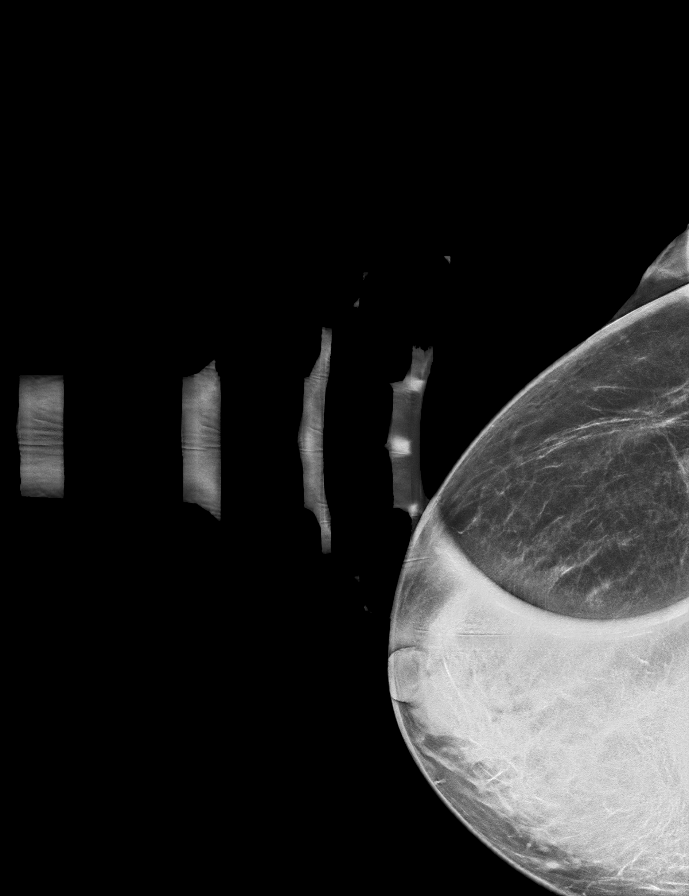

[R MLO tomo · tomo slice 26/51.0]
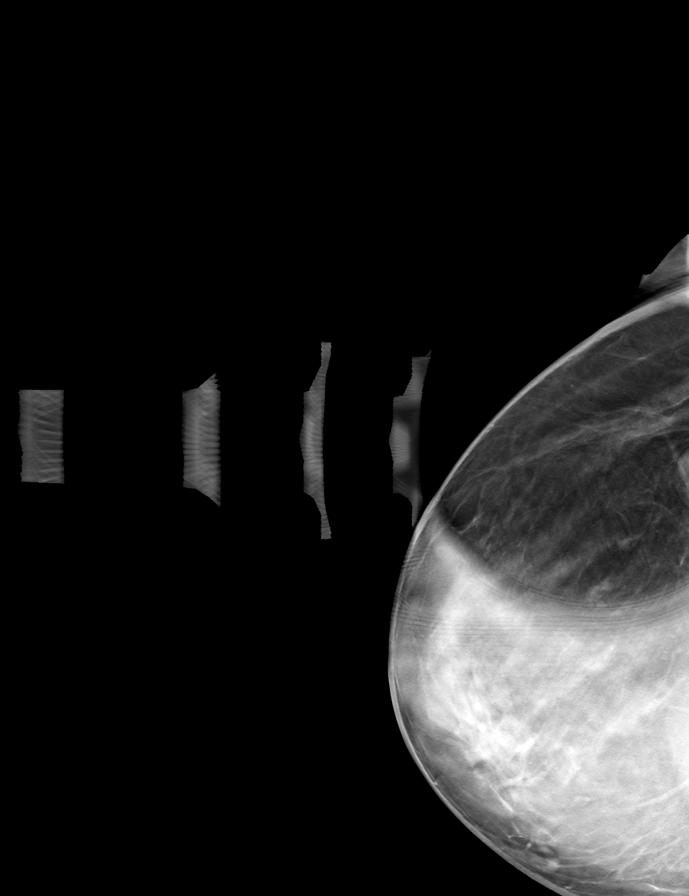

[R ML tomo · tomo slice 25/50.0]
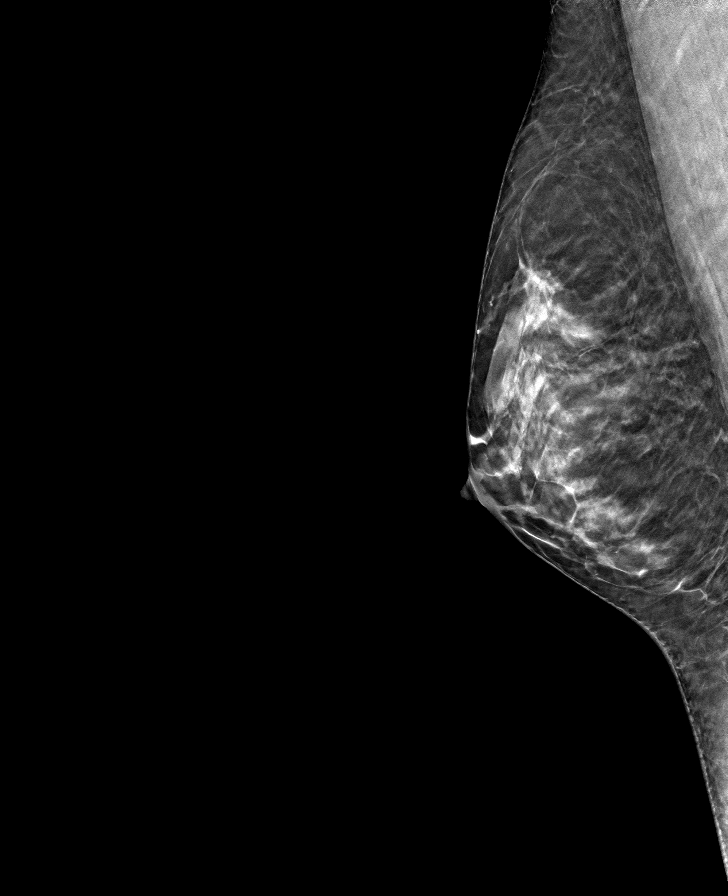

[R XCCL tomo · tomo slice 30/59.0]
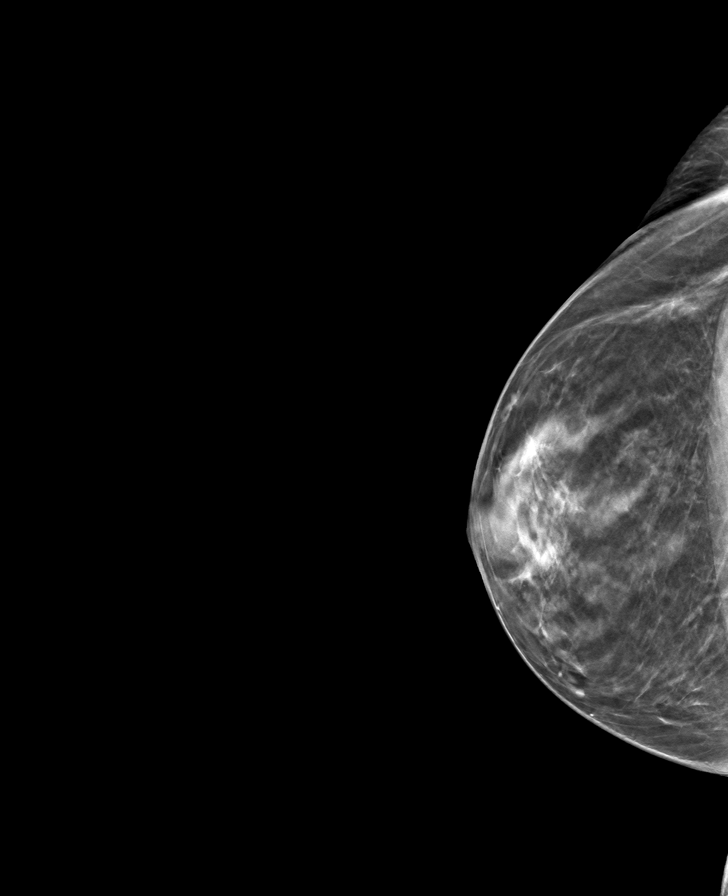

[R CC tomo · tomo slice 26/51.0]
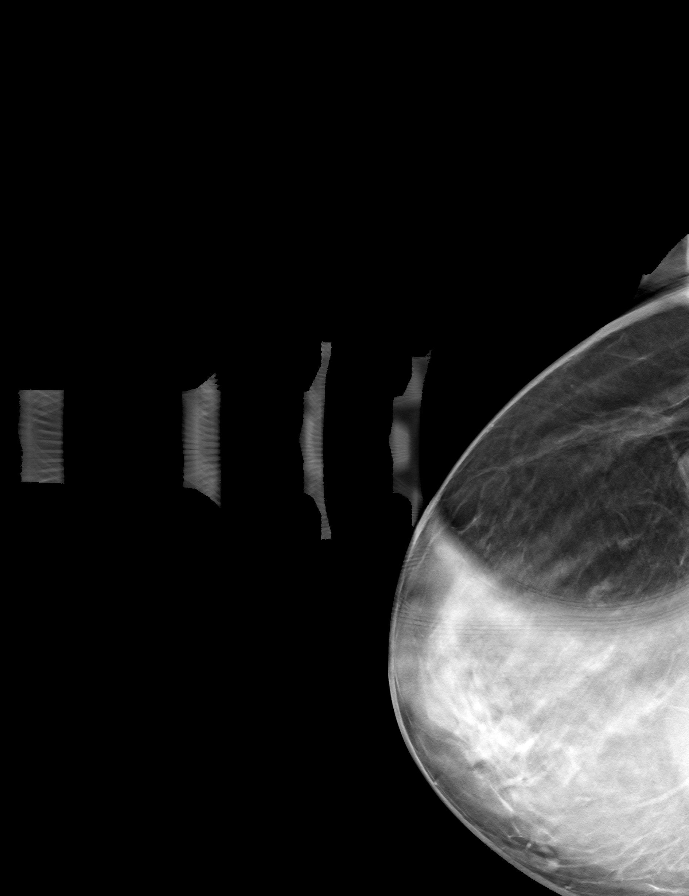

[8 of 24 positions shown; findings below may reference images not displayed]

ACR Breast Density Category c: The breast tissue is heterogeneously
dense, which may obscure small masses.
FINDINGS: On spot compression imaging, the possible asymmetry noted in the
lateral, posterior aspect of the right breast on the screening cc
view disperses consistent with superimposed fibroglandular tissue.
There is no underlying mass or significant residual asymmetry. There
is no architectural distortion and there are no suspicious
calcifications.
IMPRESSION: No evidence of breast malignancy.

RECOMMENDATION:
Screening mammogram in one year.(Code:TN-G-93F)

I have discussed the findings and recommendations with the patient.
If applicable, a reminder letter will be sent to the patient
regarding the next appointment.

BI-RADS CATEGORY  1: Negative.

## 2022-09-19 ENCOUNTER — Ambulatory Visit: Payer: 59 | Admitting: Internal Medicine

## 2022-09-19 NOTE — Progress Notes (Deleted)
Subjective:    Patient ID: Julie Jimenez, female    DOB: Sep 13, 1981, 41 y.o.   MRN: [id number]  HPI  Patient presents to clinic today with complaint of.  Review of Systems  Past Medical History:  Diagnosis Date   Anxiety    Attention deficit disorder (ADD)    Breast cyst, right    Hemorrhoids    History of shingles 2013   LGSIL on Pap smear of cervix 02/17/2003    Current Outpatient Medications  Medication Sig Dispense Refill   albuterol (VENTOLIN HFA) 108 (90 Base) MCG/ACT inhaler Inhale 2 puffs into the lungs every 6 (six) hours as needed for wheezing or shortness of breath. 8 g 0   ALPRAZolam (XANAX) 0.5 MG tablet Take 1 tablet (0.5 mg total) by mouth daily as needed for anxiety. 15 tablet 0   Ascorbic Acid (VITAMIN C) 1000 MG tablet Take 1,000 mg by mouth daily.     buPROPion (WELLBUTRIN) 75 MG tablet Take 1 tablet (75 mg total) by mouth 2 (two) times daily. 180 tablet 0   Cholecalciferol (VITAMIN D-3) 25 MCG (1000 UT) CAPS Take by mouth.     ELDERBERRY PO Take by mouth.     magnesium 30 MG tablet Take 30 mg by mouth 2 (two) times daily.     Menaquinone-7 (VITAMIN K2 PO) Take by mouth.     Omega-3 1000 MG CAPS Take by mouth.     vitamin B-12 (CYANOCOBALAMIN) 1000 MCG tablet Take 1,000 mcg by mouth daily.     Zinc 30 MG TABS Take by mouth.     No current facility-administered medications for this visit.    Allergies  Allergen Reactions   Augmentin [Amoxicillin-Pot Clavulanate] Rash   Codeine Nausea And Vomiting and Rash    Family History  Problem Relation Age of Onset   Parkinson's disease Father    Heart disease Paternal Grandmother    Heart disease Paternal Grandfather    Breast cancer Neg Hx    Colon cancer Neg Hx     Social History   Socioeconomic History   Marital status: Single    Spouse name: Not on file   Number of children: Not on file   Years of education: Not on file   Highest education level: Not on file  Occupational History   Not on file   Tobacco Use   Smoking status: Some Days    Packs/day: 0.25    Years: 10.00    Total pack years: 2.50    Types: Cigarettes   Smokeless tobacco: Never  Vaping Use   Vaping Use: Never used  Substance and Sexual Activity   Alcohol use: Yes    Alcohol/week: 8.0 standard drinks of alcohol    Types: 8 Glasses of wine per week    Comment: moderate   Drug use: No   Sexual activity: Yes    Birth control/protection: None  Other Topics Concern   Not on file  Social History Narrative   Not on file   Social Determinants of Health   Financial Resource Strain: Not on file  Food Insecurity: Not on file  Transportation Needs: Not on file  Physical Activity: Sufficiently Active (10/02/2017)   Exercise Vital Sign    Days of Exercise per Week: 5 days    Minutes of Exercise per Session: 60 min  Stress: Stress Concern Present (10/02/2017)   [employer] of Occupational Health - Occupational Stress Questionnaire    Feeling of Stress : Rather much  Social Connections: Socially Integrated (10/02/2017)   Social Connection and Isolation Panel [NHANES]    Frequency of Communication with Friends and Family: More than three times a week    Frequency of Social Gatherings with Friends and Family: Three times a week    Attends Religious Services: More than 4 times per year    Active Member of Clubs or Organizations: Yes    Attends Banker Meetings: More than 4 times per year    Marital Status: Living with partner  Intimate Partner Violence: Not At Risk (10/02/2017)   Humiliation, Afraid, Rape, and Kick questionnaire    Fear of Current or Ex-Partner: No    Emotionally Abused: No    Physically Abused: No    Sexually Abused: No     Constitutional: Denies fever, malaise, fatigue, headache or abrupt weight changes.  HEENT: Denies eye pain, eye redness, ear pain, ringing in the ears, wax buildup, runny nose, nasal congestion, bloody nose, or sore throat. Respiratory: Denies difficulty  breathing, shortness of breath, cough or sputum production.   Cardiovascular: Denies chest pain, chest tightness, palpitations or swelling in the hands or feet.  Gastrointestinal: Denies abdominal pain, bloating, constipation, diarrhea or blood in the stool.  GU: Denies urgency, frequency, pain with urination, burning sensation, blood in urine, odor or discharge. Musculoskeletal: Denies decrease in range of motion, difficulty with gait, muscle pain or joint pain and swelling.  Skin: Denies redness, rashes, lesions or ulcercations.  Neurological: Denies dizziness, difficulty with memory, difficulty with speech or problems with balance and coordination.  Psych: Denies anxiety, depression, SI/HI.  No other specific complaints in a complete review of systems (except as listed in HPI above).     Objective:   Physical Exam    There were no vitals taken for this visit. Wt Readings from Last 3 Encounters:  12/14/21 168 lb (76.2 kg)  10/05/21 174 lb (78.9 kg)  12/12/20 173 lb (78.5 kg)    General: Appears their stated age, well developed, well nourished in NAD. Skin: Warm, dry and intact. No rashes, lesions or ulcerations noted. HEENT: Head: normal shape and size; Eyes: sclera white, no icterus, conjunctiva pink, PERRLA and EOMs intact; Ears: Tm's gray and intact, normal light reflex; Nose: mucosa pink and moist, septum midline; Throat/Mouth: Teeth present, mucosa pink and moist, no exudate, lesions or ulcerations noted.  Neck:  Neck supple, trachea midline. No masses, lumps or thyromegaly present.  Cardiovascular: Normal rate and rhythm. S1,S2 noted.  No murmur, rubs or gallops noted. No JVD or BLE edema. No carotid bruits noted. Pulmonary/Chest: Normal effort and positive vesicular breath sounds. No respiratory distress. No wheezes, rales or ronchi noted.  Abdomen: Soft and nontender. Normal bowel sounds. No distention or masses noted. Liver, spleen and kidneys non palpable. Musculoskeletal:  Normal range of motion. No signs of joint swelling. No difficulty with gait.  Neurological: Alert and oriented. Cranial nerves II-XII grossly intact. Coordination normal.  Psychiatric: Mood and affect normal. Behavior is normal. Judgment and thought content normal.   EKG:  BMET    Component Value Date/Time   NA 139 12/14/2021 1342   NA 141 05/27/2014 0122   K 4.1 12/14/2021 1342   K 3.5 05/27/2014 0122   CL 105 12/14/2021 1342   CL 107 05/27/2014 0122   CO2 25 12/14/2021 1342   CO2 24 05/27/2014 0122   GLUCOSE 80 12/14/2021 1342   GLUCOSE 96 05/27/2014 0122   BUN 8 12/14/2021 1342   BUN 9 05/27/2014  0122   CREATININE 0.80 12/14/2021 1342   CALCIUM 9.2 12/14/2021 1342   CALCIUM 8.9 05/27/2014 0122   GFRNONAA >60 05/27/2014 0122   GFRAA >60 05/27/2014 0122    Lipid Panel     Component Value Date/Time   CHOL 188 12/14/2021 1342   TRIG 40 12/14/2021 1342   HDL 62 12/14/2021 1342   CHOLHDL 3.0 12/14/2021 1342   VLDL 9.4 12/12/2020 1047   LDLCALC 114 (H) 12/14/2021 1342    CBC    Component Value Date/Time   WBC 9.2 12/14/2021 1342   RBC 4.73 12/14/2021 1342   HGB 15.0 12/14/2021 1342   HGB 16.5 (H) 05/27/2014 0122   HCT 45.0 12/14/2021 1342   HCT 51.0 (H) 05/27/2014 0122   PLT 313 12/14/2021 1342   PLT 352 05/27/2014 0122   MCV 95.1 12/14/2021 1342   MCV 96 05/27/2014 0122   MCH 31.7 12/14/2021 1342   MCHC 33.3 12/14/2021 1342   RDW 11.8 12/14/2021 1342   RDW 12.6 05/27/2014 0122   LYMPHSABS 3.8 (H) 05/27/2014 0122   MONOABS 0.7 05/27/2014 0122   EOSABS 0.3 05/27/2014 0122   BASOSABS 0.1 05/27/2014 0122    Hgb A1C Lab Results  Component Value Date   HGBA1C 5.1 07/08/2015           Assessment & Plan:     RTC in 3 months for annual exam Nicki Reaper, NP

## 2022-09-30 ENCOUNTER — Telehealth: Payer: 59 | Admitting: Family Medicine

## 2022-09-30 DIAGNOSIS — R6889 Other general symptoms and signs: Secondary | ICD-10-CM | POA: Diagnosis not present

## 2022-09-30 MED ORDER — OSELTAMIVIR PHOSPHATE 75 MG PO CAPS: 75.0000 mg | ORAL_CAPSULE | Freq: Two times a day (BID) | ORAL | 0 refills | Status: DC

## 2022-09-30 MED ORDER — BENZONATATE 200 MG PO CAPS: 200.0000 mg | ORAL_CAPSULE | Freq: Two times a day (BID) | ORAL | 0 refills | Status: DC | PRN

## 2022-09-30 NOTE — Addendum Note (Signed)
Addended by: Dellia Nims on: 09/30/2022 11:45 AM   Modules accepted: Orders

## 2022-09-30 NOTE — Progress Notes (Signed)
E visit for Flu like symptoms   We are sorry that you are not feeling well.  Here is how we plan to help! Based on what you have shared with me it looks like you may have a respiratory virus that may be influenza.  Influenza or "the flu" is   an infection caused by a respiratory virus. The flu virus is highly contagious and persons who did not receive their yearly flu vaccination may "catch" the flu from close contact.  We have anti-viral medications to treat the viruses that cause this infection. They are not a "cure" and only shorten the course of the infection. These prescriptions are most effective when they are given within the first 2 days of "flu" symptoms. Antiviral medication are indicated if you have a high risk of complications from the flu. You should  also consider an antiviral medication if you are in close contact with someone who is at risk. These medications can help patients avoid complications from the flu  but have side effects that you should know. Possible side effects from Tamiflu or oseltamivir include nausea, vomiting, diarrhea, dizziness, headaches, eye redness, sleep problems or other respiratory symptoms. You should not take Tamiflu if you have an allergy to oseltamivir or any to the ingredients in Tamiflu.  Based upon your symptoms and potential risk factors I have prescribed Oseltamivir (Tamiflu).  It has been sent to your designated pharmacy.  You will take one 75 mg capsule orally twice a day for the next 5 days.  ANYONE WHO HAS FLU SYMPTOMS SHOULD: Stay home. The flu is highly contagious and going out or to work exposes others! Be sure to drink plenty of fluids. Water is fine as well as fruit juices, sodas and electrolyte beverages. You may want to stay away from caffeine or alcohol. If you are nauseated, try taking small sips of liquids. How do you know if you are getting enough fluid? Your urine should be a pale yellow or almost colorless. Get rest. Taking a steamy  shower or using a humidifier may help nasal congestion and ease sore throat pain. Using a saline nasal spray works much the same way. Cough drops, hard candies and sore throat lozenges may ease your cough. Line up a caregiver. Have someone check on you regularly.   GET HELP RIGHT AWAY IF: You cannot keep down liquids or your medications. You become short of breath Your fell like you are going to pass out or loose consciousness. Your symptoms persist after you have completed your treatment plan MAKE SURE YOU  Understand these instructions. Will watch your condition. Will get help right away if you are not doing well or get worse.  Your e-visit answers were reviewed by a board certified advanced clinical practitioner to complete your personal care plan.  Depending on the condition, your plan could have included both over the counter or prescription medications.  If there is a problem please reply  once you have received a response from your provider.  Your safety is important to us.  If you have drug allergies check your prescription carefully.    You can use MyChart to ask questions about today's visit, request a non-urgent call back, or ask for a work or school excuse for 24 hours related to this e-Visit. If it has been greater than 24 hours you will need to follow up with your provider, or enter a new e-Visit to address those concerns.  You will get an e-mail in the next   two days asking about your experience.  I hope that your e-visit has been valuable and will speed your recovery. Thank you for using e-visits.    have provided 5 minutes of non face to face time during this encounter for chart review and documentation.   

## 2022-10-01 ENCOUNTER — Encounter: Payer: Self-pay | Admitting: Internal Medicine

## 2022-10-01 ENCOUNTER — Ambulatory Visit: Payer: 59 | Admitting: Internal Medicine

## 2022-10-01 VITALS — BP 124/85 | HR 100 | Temp 97.1°F | Wt 171.0 lb

## 2022-10-01 DIAGNOSIS — R519 Headache, unspecified: Secondary | ICD-10-CM

## 2022-10-01 DIAGNOSIS — J029 Acute pharyngitis, unspecified: Secondary | ICD-10-CM | POA: Diagnosis not present

## 2022-10-01 DIAGNOSIS — R69 Illness, unspecified: Secondary | ICD-10-CM | POA: Diagnosis not present

## 2022-10-01 DIAGNOSIS — R112 Nausea with vomiting, unspecified: Secondary | ICD-10-CM | POA: Diagnosis not present

## 2022-10-01 DIAGNOSIS — R509 Fever, unspecified: Secondary | ICD-10-CM

## 2022-10-01 DIAGNOSIS — F17200 Nicotine dependence, unspecified, uncomplicated: Secondary | ICD-10-CM

## 2022-10-01 DIAGNOSIS — R5383 Other fatigue: Secondary | ICD-10-CM | POA: Diagnosis not present

## 2022-10-01 LAB — POCT INFLUENZA A/B
Influenza A, POC: NEGATIVE
Influenza B, POC: NEGATIVE

## 2022-10-01 LAB — POC COVID19 BINAXNOW: SARS Coronavirus 2 Ag: NEGATIVE

## 2022-10-01 MED ORDER — BUPROPION HCL 75 MG PO TABS: 75.0000 mg | ORAL_TABLET | Freq: Two times a day (BID) | ORAL | 0 refills | Status: DC

## 2022-10-01 NOTE — Patient Instructions (Signed)

## 2022-10-01 NOTE — Addendum Note (Signed)
Addended by: Jearld Fenton on: 10/01/2022 10:27 AM   Modules accepted: Orders

## 2022-10-01 NOTE — Progress Notes (Signed)
Subjective:    Patient ID: Julie Jimenez, female    DOB: 12/11/1980, 42 y.o.   MRN: [id number]  HPI  Patient presents to clinic today with complaint of fatigue, fever, chills, headache, sore throat, nausea and vomiting. This started 3 days ago.  She is no longer running a fever.  She denies difficulty swallowing.  She reports nausea and vomiting have resolved.  She did an e-visit yesterday for the same, was diagnosed with flulike symptoms and prescribed Tamiflu.  She would like a flu test today.  She reports prior to this illness, she had the stomach bug around Christmas, got better after few days.  She reports she subsequently felt fatigued, ran a fever and had a runny nose around [place].  She tried Zyrtec and Flonase OTC.  She reports she got better for a week and is ill again.  She has not had sick contacts that she is aware of.  She would also like to get restarted on Wellbutrin for smoking cessation.  She is currently smoking 7 cigarettes a day.  Review of Systems  Past Medical History:  Diagnosis Date   Anxiety    Attention deficit disorder (ADD)    Breast cyst, right    Hemorrhoids    History of shingles 2013   LGSIL on Pap smear of cervix 02/17/2003    Current Outpatient Medications  Medication Sig Dispense Refill   albuterol (VENTOLIN HFA) 108 (90 Base) MCG/ACT inhaler Inhale 2 puffs into the lungs every 6 (six) hours as needed for wheezing or shortness of breath. 8 g 0   ALPRAZolam (XANAX) 0.5 MG tablet Take 1 tablet (0.5 mg total) by mouth daily as needed for anxiety. 15 tablet 0   Ascorbic Acid (VITAMIN C) 1000 MG tablet Take 1,000 mg by mouth daily.     benzonatate (TESSALON) 200 MG capsule Take 1 capsule (200 mg total) by mouth 2 (two) times daily as needed for cough. 20 capsule 0   buPROPion (WELLBUTRIN) 75 MG tablet Take 1 tablet (75 mg total) by mouth 2 (two) times daily. 180 tablet 0   Cholecalciferol (VITAMIN D-3) 25 MCG (1000 UT) CAPS Take by mouth.      ELDERBERRY PO Take by mouth.     magnesium 30 MG tablet Take 30 mg by mouth 2 (two) times daily.     Menaquinone-7 (VITAMIN K2 PO) Take by mouth.     Omega-3 1000 MG CAPS Take by mouth.     oseltamivir (TAMIFLU) 75 MG capsule Take 1 capsule (75 mg total) by mouth 2 (two) times daily for 5 days. 10 capsule 0   vitamin B-12 (CYANOCOBALAMIN) 1000 MCG tablet Take 1,000 mcg by mouth daily.     Zinc 30 MG TABS Take by mouth.     No current facility-administered medications for this visit.    Allergies  Allergen Reactions   Augmentin [Amoxicillin-Pot Clavulanate] Rash   Codeine Nausea And Vomiting and Rash    Family History  Problem Relation Age of Onset   Parkinson's disease Father    Heart disease Paternal Grandmother    Heart disease Paternal Grandfather    Breast cancer Neg Hx    Colon cancer Neg Hx     Social History   Socioeconomic History   Marital status: Single    Spouse name: Not on file   Number of children: Not on file   Years of education: Not on file   Highest education level: Not on file  Occupational  History   Not on file  Tobacco Use   Smoking status: Some Days    Packs/day: 0.25    Years: 10.00    Total pack years: 2.50    Types: Cigarettes   Smokeless tobacco: Never  Vaping Use   Vaping Use: Never used  Substance and Sexual Activity   Alcohol use: Yes    Alcohol/week: 8.0 standard drinks of alcohol    Types: 8 Glasses of wine per week    Comment: moderate   Drug use: No   Sexual activity: Yes    Birth control/protection: None  Other Topics Concern   Not on file  Social History Narrative   Not on file   Social Determinants of Health   Financial Resource Strain: Not on file  Food Insecurity: Not on file  Transportation Needs: Not on file  Physical Activity: Sufficiently Active (10/02/2017)   Exercise Vital Sign    Days of Exercise per Week: 5 days    Minutes of Exercise per Session: 60 min  Stress: Stress Concern Present (10/02/2017)   [place]    Feeling of Stress : Rather much  Social Connections: Socially Integrated (10/02/2017)   Social Connection and Isolation Panel [NHANES]    Frequency of Communication with Friends and Family: More than three times a week    Frequency of Social Gatherings with Friends and Family: Three times a week    Attends Religious Services: More than 4 times per year    Active Member of Clubs or Organizations: Yes    Attends Archivist Meetings: More than 4 times per year    Marital Status: Living with partner  Intimate Partner Violence: Not At Risk (10/02/2017)   Humiliation, Afraid, Rape, and Kick questionnaire    Fear of Current or Ex-Partner: No    Emotionally Abused: No    Physically Abused: No    Sexually Abused: No     Constitutional: Patient reports fatigue, fever, chills.  Denies malaise, headache or abrupt weight changes.  HEENT: Patient reports sore throat.  Denies eye pain, eye redness, ear pain, ringing in the ears, wax buildup, runny nose, nasal congestion, bloody nose. Respiratory: Denies difficulty breathing, shortness of breath, cough or sputum production.   Cardiovascular: Denies chest pain, chest tightness, palpitations or swelling in the hands or feet.  Gastrointestinal: Pt reports nausea and vomiting. Denies abdominal pain, bloating, constipation, diarrhea or blood in the stool.  Musculoskeletal: Denies decrease in range of motion, difficulty with gait, muscle pain or joint pain and swelling.  Skin: Denies redness, rashes, lesions or ulcercations.  Neurological: Denies dizziness, difficulty with memory, difficulty with speech or problems with balance and coordination.    No other specific complaints in a complete review of systems (except as listed in HPI above).     Objective:   Physical Exam  BP 124/85 (BP Location: Right Arm, Patient Position: Sitting, Cuff Size: Normal)   Pulse 100   Temp (!)  97.1 F (36.2 C) (Temporal)   Wt 171 lb (77.6 kg)   SpO2 100%   BMI 24.54 kg/m   Wt Readings from Last 3 Encounters:  12/14/21 168 lb (76.2 kg)  10/05/21 174 lb (78.9 kg)  12/12/20 173 lb (78.5 kg)    General: Appears her stated age, well developed, well nourished in NAD. Skin: Warm, dry and intact.  HEENT: Head: normal shape and size, no sinus tenderness noted; Eyes: sclera white, no icterus, conjunctiva pink,  PERRLA and EOMs intact; Throat/Mouth: Teeth present, mucosa pink and moist, no exudate, lesions or ulcerations noted.  Neck:  No adenopathy noted. Cardiovascular: Normal rate and rhythm. S1,S2 noted.  No murmur, rubs or gallops noted.  Pulmonary/Chest: Normal effort and positive vesicular breath sounds. No respiratory distress. No wheezes, rales or ronchi noted.  Musculoskeletal:  No difficulty with gait.  Neurological: Alert and oriented.  Psychiatric: Mood and affect normal. Anxious appearing. Judgment and thought content normal.    BMET    Component Value Date/Time   NA 139 12/14/2021 1342   NA 141 05/27/2014 0122   K 4.1 12/14/2021 1342   K 3.5 05/27/2014 0122   CL 105 12/14/2021 1342   CL 107 05/27/2014 0122   CO2 25 12/14/2021 1342   CO2 24 05/27/2014 0122   GLUCOSE 80 12/14/2021 1342   GLUCOSE 96 05/27/2014 0122   BUN 8 12/14/2021 1342   BUN 9 05/27/2014 0122   CREATININE 0.80 12/14/2021 1342   CALCIUM 9.2 12/14/2021 1342   CALCIUM 8.9 05/27/2014 0122   GFRNONAA >60 05/27/2014 0122   GFRAA >60 05/27/2014 0122    Lipid Panel     Component Value Date/Time   CHOL 188 12/14/2021 1342   TRIG 40 12/14/2021 1342   HDL 62 12/14/2021 1342   CHOLHDL 3.0 12/14/2021 1342   VLDL 9.4 12/12/2020 1047   LDLCALC 114 (H) 12/14/2021 1342    CBC    Component Value Date/Time   WBC 9.2 12/14/2021 1342   RBC 4.73 12/14/2021 1342   HGB 15.0 12/14/2021 1342   HGB 16.5 (H) 05/27/2014 0122   HCT 45.0 12/14/2021 1342   HCT 51.0 (H) 05/27/2014 0122   PLT 313  12/14/2021 1342   PLT 352 05/27/2014 0122   MCV 95.1 12/14/2021 1342   MCV 96 05/27/2014 0122   MCH 31.7 12/14/2021 1342   MCHC 33.3 12/14/2021 1342   RDW 11.8 12/14/2021 1342   RDW 12.6 05/27/2014 0122   LYMPHSABS 3.8 (H) 05/27/2014 0122   MONOABS 0.7 05/27/2014 0122   EOSABS 0.3 05/27/2014 0122   BASOSABS 0.1 05/27/2014 0122    Hgb A1C Lab Results  Component Value Date   HGBA1C 5.1 07/08/2015           Assessment & Plan:   Fatigue, Fever, Chills, Headache, Sore Throat, Nausea and Vomiting:  Rapid covid negative Rapid flu negative No indication for strep testing at this time Will check CBC, c-Met and TSH today for further evaluation of symptoms Discussed that she could be getting back-to-back viral infections due to the fact that she has no compromised immune system secondary to stress Encourage stress reduction techniques  Smoker:  Will refill Wellbutrin 75 mg twice daily, she is unable to tolerate 150 mg twice daily Encouraged her to pick a quit date and stick with it  RTC in 2 months for annual exam Nicki Reaper, NP

## 2022-10-02 ENCOUNTER — Encounter: Payer: Self-pay | Admitting: Internal Medicine

## 2022-10-02 LAB — COMPLETE METABOLIC PANEL WITH GFR
AG Ratio: 2.2 (calc) (ref 1.0–2.5)
ALT: 13 U/L (ref 6–29)
AST: 15 U/L (ref 10–30)
Albumin: 4 g/dL (ref 3.6–5.1)
Alkaline phosphatase (APISO): 46 U/L (ref 31–125)
BUN/Creatinine Ratio: 10 (calc) (ref 6–22)
BUN: 6 mg/dL — ABNORMAL LOW (ref 7–25)
CO2: 26 mmol/L (ref 20–32)
Calcium: 9.1 mg/dL (ref 8.6–10.2)
Chloride: 108 mmol/L (ref 98–110)
Creat: 0.6 mg/dL (ref 0.50–0.99)
Globulin: 1.8 g/dL (calc) — ABNORMAL LOW (ref 1.9–3.7)
Glucose, Bld: 79 mg/dL (ref 65–99)
Potassium: 4.6 mmol/L (ref 3.5–5.3)
Sodium: 141 mmol/L (ref 135–146)
Total Bilirubin: 0.4 mg/dL (ref 0.2–1.2)
Total Protein: 5.8 g/dL — ABNORMAL LOW (ref 6.1–8.1)
eGFR: 116 mL/min/{1.73_m2} (ref >=60)

## 2022-10-02 LAB — CBC
HCT: 42.3 % (ref 35.0–45.0)
Hemoglobin: 14.6 g/dL (ref 11.7–15.5)
MCH: 31.3 pg (ref 27.0–33.0)
MCHC: 34.5 g/dL (ref 32.0–36.0)
MCV: 90.8 fL (ref 80.0–100.0)
MPV: 9.4 fL (ref 7.5–12.5)
Platelets: 375 10*3/uL (ref 140–400)
RBC: 4.66 10*6/uL (ref 3.80–5.10)
RDW: 11.7 % (ref 11.0–15.0)
WBC: 12.3 10*3/uL — ABNORMAL HIGH (ref 3.8–10.8)

## 2022-10-02 LAB — TSH: TSH: 1.98 mIU/L

## 2022-10-03 ENCOUNTER — Other Ambulatory Visit: Payer: Self-pay | Admitting: Internal Medicine

## 2022-10-03 MED ORDER — ALPRAZOLAM 0.5 MG PO TABS: 0.5000 mg | ORAL_TABLET | Freq: Every day | ORAL | 0 refills | Status: DC | PRN

## 2022-10-25 ENCOUNTER — Other Ambulatory Visit: Payer: Self-pay | Admitting: Obstetrics and Gynecology

## 2022-10-25 DIAGNOSIS — Z1231 Encounter for screening mammogram for malignant neoplasm of breast: Secondary | ICD-10-CM

## 2022-10-31 ENCOUNTER — Encounter: Payer: Self-pay | Admitting: Family Medicine

## 2022-10-31 ENCOUNTER — Telehealth: Payer: 59 | Admitting: Family Medicine

## 2022-10-31 ENCOUNTER — Encounter: Payer: Self-pay | Admitting: Internal Medicine

## 2022-10-31 VITALS — Ht 70.0 in | Wt 171.0 lb

## 2022-10-31 DIAGNOSIS — U071 COVID-19: Secondary | ICD-10-CM | POA: Diagnosis not present

## 2022-10-31 DIAGNOSIS — J9801 Acute bronchospasm: Secondary | ICD-10-CM | POA: Diagnosis not present

## 2022-10-31 MED ORDER — PREDNISONE 10 MG PO TABS: ORAL_TABLET | ORAL | 0 refills | Status: DC

## 2022-10-31 MED ORDER — FLUTICASONE PROPIONATE 50 MCG/ACT NA SUSP: 2.0000 | Freq: Every day | NASAL | 0 refills | Status: DC

## 2022-10-31 MED ORDER — ALBUTEROL SULFATE HFA 108 (90 BASE) MCG/ACT IN AERS: 2.0000 | INHALATION_SPRAY | Freq: Four times a day (QID) | RESPIRATORY_TRACT | 0 refills | Status: DC | PRN

## 2022-10-31 NOTE — Patient Instructions (Addendum)
Start Prednisone taper 6 days  After prednisone, take flonase Start nasal steroid Flonase 2 sprays in each nostril daily for 4-6 weeks, may repeat course seasonally or as needed  May use Albuterol rescue inhaler 1-2 puff every 4-6 hour for short of breath or cough or wheezing  Okay to skip Paxlovid  Mucinex only if chest congestion  If not improving by next week or worse symptoms more productive cough, fevers etc, we can consider Chest X-ray here.  Please schedule a Follow-up Appointment to: Return in about 1 week (around 11/07/2022), or if symptoms worsen or fail to improve, for covid.  If you have any other questions or concerns, please feel free to call the office or send a message through [place]. You may also schedule an earlier appointment if necessary.  Additionally, you may be receiving a survey about your experience at our office within a few days to 1 week by e-mail or mail. We value your feedback.  Nobie Putnam, DO [place]

## 2022-10-31 NOTE — Progress Notes (Signed)
Subjective:    Patient ID: Julie Jimenez, female    DOB: [birth year], 42 y.o.   MRN: [id number]  Julie Jimenez is a 42 y.o. female presenting on 10/31/2022 for Cough and Sore Throat  Virtual / Telehealth Encounter - Video Visit via MyChart The purpose of this virtual visit is to provide medical care while limiting exposure to the novel coronavirus (COVID19) for both patient and office staff.  Consent was obtained for remote visit:  Yes.   Answered questions that patient had about telehealth interaction:  Yes.   I discussed the limitations, risks, security and privacy concerns of performing an evaluation and management service by video/telephone. I also discussed with the patient that there may be a patient responsible charge related to this service. The patient expressed understanding and agreed to proceed.  Patient Location: Home Provider Location: [facility] (Office)  Participants in virtual visit: - Patient: [place]: Orinda Kenner, CMA - Provider: Dr Parks Ranger  Patient presents for a same day appointment.  PCP Webb Silversmith, FNP    HPI  COVID-19 Infection Sick contacts with parents and sister with COVID. She was not around them for 1 week and then now was in contact on their Day 8 She started with symptoms last night with cough and tight chest. Some congestion Taking Mucinex, ibuprofen. Limited relief Essential Oil vapor rub, Herbal tea History of COVID in the summer and had mild flu like symptoms and aching.  Fam history of trial on Paxlovid and they did not do as well. She prefers to avoid No history of asthma Intermittent smoker No history of asthma.  Health Maintenance: COVID vaccine previously. But no documented recent vaccine     10/01/2022    9:51 AM 12/14/2021    1:33 PM 12/12/2020    9:35 AM  Depression screen PHQ 2/9  Decreased Interest 0 1 0  Down, Depressed, Hopeless 0 2 0  PHQ - 2 Score 0 3 0  Altered sleeping  3    Tired, decreased energy  0   Change in appetite  1   Feeling bad or failure about yourself   2   Trouble concentrating  0   Moving slowly or fidgety/restless  0   Suicidal thoughts  0   PHQ-9 Score  9   Difficult doing work/chores  Not difficult at all     Social History   Tobacco Use   Smoking status: Some Days    Packs/day: 0.25    Years: 10.00    Total pack years: 2.50    Types: Cigarettes   Smokeless tobacco: Never  Vaping Use   Vaping Use: Never used  Substance Use Topics   Alcohol use: Yes    Alcohol/week: 8.0 standard drinks of alcohol    Types: 8 Glasses of wine per week    Comment: moderate   Drug use: No    Review of Systems Per HPI unless specifically indicated above     Objective:    Ht 5\' 10"  (1.778 m)   Wt 171 lb (77.6 kg)   BMI 24.54 kg/m   Wt Readings from Last 3 Encounters:  10/31/22 171 lb (77.6 kg)  10/01/22 171 lb (77.6 kg)  12/14/21 168 lb (76.2 kg)    Physical Exam  Note examination was completely remotely via video observation objective data only  Gen - well-appearing, no acute distress or apparent pain, comfortable HEENT - eyes appear clear without discharge or redness  Heart/Lungs - cannot examine virtually - coughing Abd - cannot examine virtually  Skin - face visible today- no rash Neuro - awake, alert, oriented Psych - not anxious appearing   Results for orders placed or performed in visit on 10/01/22  CBC  Result Value Ref Range   WBC 12.3 (H) 3.8 - 10.8 Thousand/uL   RBC 4.66 3.80 - 5.10 Million/uL   Hemoglobin 14.6 11.7 - 15.5 g/dL   HCT 42.3 35.0 - 45.0 %   MCV 90.8 80.0 - 100.0 fL   MCH 31.3 27.0 - 33.0 pg   MCHC 34.5 32.0 - 36.0 g/dL   RDW 11.7 11.0 - 15.0 %   Platelets 375 140 - 400 Thousand/uL   MPV 9.4 7.5 - 12.5 fL  COMPLETE METABOLIC PANEL WITH GFR  Result Value Ref Range   Glucose, Bld 79 65 - 99 mg/dL   BUN 6 (L) 7 - 25 mg/dL   Creat 0.60 0.50 - 0.99 mg/dL   eGFR 116 > OR = 60 mL/min/1.77m2    BUN/Creatinine Ratio 10 6 - 22 (calc)   Sodium 141 135 - 146 mmol/L   Potassium 4.6 3.5 - 5.3 mmol/L   Chloride 108 98 - 110 mmol/L   CO2 26 20 - 32 mmol/L   Calcium 9.1 8.6 - 10.2 mg/dL   Total Protein 5.8 (L) 6.1 - 8.1 g/dL   Albumin 4.0 3.6 - 5.1 g/dL   Globulin 1.8 (L) 1.9 - 3.7 g/dL (calc)   AG Ratio 2.2 1.0 - 2.5 (calc)   Total Bilirubin 0.4 0.2 - 1.2 mg/dL   Alkaline phosphatase (APISO) 46 31 - 125 U/L   AST 15 10 - 30 U/L   ALT 13 6 - 29 U/L  TSH  Result Value Ref Range   TSH 1.98 mIU/L  POC COVID-19  Result Value Ref Range   SARS Coronavirus 2 Ag Negative Negative  POCT Influenza A/B  Result Value Ref Range   Influenza A, POC Negative Negative   Influenza B, POC Negative Negative      Assessment & Plan:   Problem List Items Addressed This Visit   None Visit Diagnoses     COVID-19 virus infection    -  Primary   Relevant Medications   predniSONE (DELTASONE) 10 MG tablet   albuterol (VENTOLIN HFA) 108 (90 Base) MCG/ACT inhaler   fluticasone (FLONASE) 50 MCG/ACT nasal spray   Bronchospasm       Relevant Medications   predniSONE (DELTASONE) 10 MG tablet   albuterol (VENTOLIN HFA) 108 (90 Base) MCG/ACT inhaler       COVID19 positive Symptom 1st onset 10/30/22 Confirm home test positive 10/31/22 Mild symptoms currently. No red flags or dyspnea Limited risk factors for covid complication, she is active smoker  Mutual decision to avoid anti viral therapy at this time. Due to limited symptoms and limited risk factors, side effect potential on med Prednisone 6 day taper ordered Back up plan Albuterol AS NEEDED / Flonase course after steroid Mucinex if chest congestion Tylenol anytime if need Ibuprofen after steroid only Supportive care OTC PRN Follow-up criteria given if worsening, consider CXR   Meds ordered this encounter  Medications   predniSONE (DELTASONE) 10 MG tablet    Sig: Take 6 tabs with breakfast Day 1, 5 tabs Day 2, 4 tabs Day 3, 3 tabs Day 4, 2  tabs Day 5, 1 tab Day 6.    Dispense:  21 tablet    Refill:  0  albuterol (VENTOLIN HFA) 108 (90 Base) MCG/ACT inhaler    Sig: Inhale 2 puffs into the lungs every 6 (six) hours as needed for wheezing or shortness of breath.    Dispense:  8 g    Refill:  0   fluticasone (FLONASE) 50 MCG/ACT nasal spray    Sig: Place 2 sprays into both nostrils daily. Use for 4-6 weeks then stop and use seasonally or as needed.    Dispense:  16 g    Refill:  0      Follow up plan: Return in about 1 week (around 11/07/2022), or if symptoms worsen or fail to improve, for covid.   Patient verbalizes understanding with the above medical recommendations including the limitation of remote medical advice.  Specific follow-up and call-back criteria were given for patient to follow-up or seek medical care more urgently if needed.  Total duration of direct patient care provided via video conference: 10 minutes   Nobie Putnam, [facility] 10/31/2022, 11:54 AM

## 2022-11-03 ENCOUNTER — Other Ambulatory Visit: Payer: Self-pay | Admitting: Internal Medicine

## 2022-11-03 MED ORDER — ALPRAZOLAM 0.5 MG PO TABS: 0.5000 mg | ORAL_TABLET | Freq: Every day | ORAL | 0 refills | Status: DC | PRN

## 2022-11-22 ENCOUNTER — Ambulatory Visit
Admission: RE | Admit: 2022-11-22 | Discharge: 2022-11-22 | Disposition: A | Discharge status: Home / Self Care | Payer: 59 | Source: Ambulatory Visit | Attending: Obstetrics and Gynecology | Admitting: Obstetrics and Gynecology

## 2022-11-22 DIAGNOSIS — Z1231 Encounter for screening mammogram for malignant neoplasm of breast: Secondary | ICD-10-CM | POA: Insufficient documentation

## 2022-11-24 ENCOUNTER — Encounter: Payer: Self-pay | Admitting: Obstetrics and Gynecology

## 2022-11-26 ENCOUNTER — Other Ambulatory Visit: Payer: Self-pay | Admitting: Obstetrics and Gynecology

## 2022-11-26 DIAGNOSIS — N63 Unspecified lump in unspecified breast: Secondary | ICD-10-CM

## 2022-11-28 ENCOUNTER — Ambulatory Visit
Admission: RE | Admit: 2022-11-28 | Discharge: 2022-11-28 | Disposition: A | Discharge status: Home / Self Care | Payer: 59 | Source: Ambulatory Visit | Attending: Obstetrics and Gynecology | Admitting: Obstetrics and Gynecology

## 2022-11-28 DIAGNOSIS — N63 Unspecified lump in unspecified breast: Secondary | ICD-10-CM | POA: Diagnosis not present

## 2022-11-28 DIAGNOSIS — R928 Other abnormal and inconclusive findings on diagnostic imaging of breast: Secondary | ICD-10-CM | POA: Diagnosis not present

## 2022-11-28 NOTE — Progress Notes (Signed)
PCP:  Jearld Fenton, NP   Chief Complaint  Patient presents with   Gynecologic Exam    Discuss recentg mammogram     HPI:      [name] is a 42 y.o. No obstetric history on file. who LMP was Patient's last menstrual period was 11/26/2022 (exact date)., presents today for her annual examination.  Her menses are regular every 28-30 days, lasting 5 days, light flow. Dysmenorrhea mild. She does not have intermenstrual bleeding.  Sex activity: single partner, contraception - withdrawal. Doesn't want hormones/IUD. Hx of deep dyspareunia for many yrs, sx resolved.  Last Pap: 10/05/21 Results were: no abnormalities /neg HPV DNA  Hx of STDs: HPV/LGSIL 2004  Last mammo: 11/28/22 Results were cat 4 RT breast; retroareolar mass increased in size compared to prior imaging; bx recommended, low suspicion for malignancy. Hx of stable RT breast cyst under nipple. Had [facility] 2007 and 2008. No longer tender for pt. Pt has questions about bx needed vs repeat imaging in 3-6 months since she was on her period during mammo and this can affect size of cyst.  There is no FH of breast cancer. There is no FH of ovarian cancer. The patient does self-breast exams.    Tobacco use: quit, just occas with alcohol use now; doing wellbutrin and nicorette with PCP Alcohol use: social No drug use.  Exercise: very active  She does get adequate calcium and Vitamin D in her diet. Labs with PCP  Past Medical History:  Diagnosis Date   Anxiety    Attention deficit disorder (ADD)    Breast cyst, right    Hemorrhoids    History of shingles 2013   LGSIL on Pap smear of cervix 02/17/2003    Past Surgical History:  Procedure Laterality Date   EXTERNAL EAR SURGERY     TONSILLECTOMY AND ADENOIDECTOMY      Family History  Problem Relation Age of Onset   Parkinson's disease Father    Heart disease Paternal Grandmother    Heart disease Paternal Grandfather    Breast cancer Neg Hx    Colon  cancer Neg Hx     Social History   Socioeconomic History   Marital status: Single    Spouse name: Not on file   Number of children: Not on file   Years of education: Not on file   Highest education level: Not on file  Occupational History   Not on file  Tobacco Use   Smoking status: Some Days    Packs/day: 0.25    Years: 10.00    Total pack years: 2.50    Types: Cigarettes   Smokeless tobacco: Never  Vaping Use   Vaping Use: Never used  Substance and Sexual Activity   Alcohol use: Yes    Alcohol/week: 8.0 standard drinks of alcohol    Types: 8 Glasses of wine per week    Comment: moderate   Drug use: No   Sexual activity: Yes    Birth control/protection: None  Other Topics Concern   Not on file  Social History Narrative   Not on file   Social Determinants of Health   Financial Resource Strain: Not on file  Food Insecurity: Not on file  Transportation Needs: Not on file  Physical Activity: Sufficiently Active (10/02/2017)   Exercise Vital Sign    Days of Exercise per Week: 5 days    Minutes of Exercise per Session: 60 min  Stress: Stress  Concern Present (10/02/2017)   [place]    Feeling of Stress : Rather much  Social Connections: Socially Integrated (10/02/2017)   Social Connection and Isolation Panel [NHANES]    Frequency of Communication with Friends and Family: More than three times a week    Frequency of Social Gatherings with Friends and Family: Three times a week    Attends Religious Services: More than 4 times per year    Active Member of Clubs or Organizations: Yes    Attends Archivist Meetings: More than 4 times per year    Marital Status: Living with partner  Intimate Partner Violence: Not At Risk (10/02/2017)   Humiliation, Afraid, Rape, and Kick questionnaire    Fear of Current or Ex-Partner: No    Emotionally Abused: No    Physically Abused: No    Sexually Abused: No     Current Meds  Medication Sig   ALPRAZolam (XANAX) 0.5 MG tablet Take 1 tablet (0.5 mg total) by mouth daily as needed for anxiety.   Ascorbic Acid (VITAMIN C) 1000 MG tablet Take 1,000 mg by mouth daily.   buPROPion (WELLBUTRIN) 75 MG tablet Take 1 tablet (75 mg total) by mouth 2 (two) times daily.   Cholecalciferol (VITAMIN D-3) 25 MCG (1000 UT) CAPS Take by mouth.   ELDERBERRY PO Take by mouth.   magnesium 30 MG tablet Take 30 mg by mouth 2 (two) times daily.   Menaquinone-7 (VITAMIN K2 PO) Take by mouth.   Omega-3 1000 MG CAPS Take by mouth.   valACYclovir (VALTREX) 500 MG tablet Take by mouth.   vitamin B-12 (CYANOCOBALAMIN) 1000 MCG tablet Take 1,000 mcg by mouth daily.   Zinc 30 MG TABS Take by mouth.     ROS:  Review of Systems  Constitutional:  Negative for fatigue, fever and unexpected weight change.  Respiratory:  Negative for cough, shortness of breath and wheezing.   Cardiovascular:  Negative for chest pain, palpitations and leg swelling.  Gastrointestinal:  Negative for blood in stool, constipation, diarrhea, nausea and vomiting.  Endocrine: Negative for cold intolerance, heat intolerance and polyuria.  Genitourinary:  Negative for dyspareunia, dysuria, flank pain, frequency, genital sores, hematuria, menstrual problem, pelvic pain, urgency, vaginal bleeding, vaginal discharge and vaginal pain.  Musculoskeletal:  Negative for back pain, joint swelling and myalgias.  Skin:  Negative for rash.  Neurological:  Negative for dizziness, syncope, light-headedness, numbness and headaches.  Hematological:  Negative for adenopathy.  Psychiatric/Behavioral:  Negative for agitation, confusion, sleep disturbance and suicidal ideas. The patient is not nervous/anxious.      Objective: BP 104/70   Ht 5\' 10"  (1.778 m)   Wt 172 lb (78 kg)   LMP 11/26/2022 (Exact Date)   BMI 24.68 kg/m    Physical Exam Constitutional:      Appearance: She is well-developed.   Genitourinary:     Vulva normal.     Right Labia: No rash, tenderness or lesions.    Left Labia: No tenderness, lesions or rash.    No vaginal discharge, erythema or tenderness.      Right Adnexa: not tender and no mass present.    Left Adnexa: not tender and no mass present.    No cervical motion tenderness, friability or polyp.     Uterus is not enlarged or tender.  Breasts:    Right: Mass present. No nipple discharge, skin change or tenderness.     Left: No mass, nipple  discharge, skin change or tenderness.  Neck:     Thyroid: No thyromegaly.  Cardiovascular:     Rate and Rhythm: Normal rate and regular rhythm.     Heart sounds: Normal heart sounds. No murmur heard. Pulmonary:     Effort: Pulmonary effort is normal.     Breath sounds: Normal breath sounds.  Chest:    Abdominal:     Palpations: Abdomen is soft.     Tenderness: There is no abdominal tenderness. There is no guarding or rebound.  Musculoskeletal:        General: Normal range of motion.     Cervical back: Normal range of motion.  Lymphadenopathy:     Cervical: No cervical adenopathy.  Neurological:     General: No focal deficit present.     Mental Status: She is alert and oriented to person, place, and time.     Cranial Nerves: No cranial nerve deficit.  Skin:    General: Skin is warm and dry.  Psychiatric:        Mood and Affect: Mood normal.        Behavior: Behavior normal.        Thought Content: Thought content normal.        Judgment: Judgment normal.  Vitals reviewed.     Assessment/Plan: Encounter for annual routine gynecological examination  Encounter for screening mammogram for malignant neoplasm of breast  Abnormal mammogram --RT breast cyst has enlarged on imaging. Pt considering bx but has questions. I will message radiologist to try to get clarification for the pt. Bx order already signed.   GYN counsel breast self exam, adequate intake of calcium and vitamin D, diet and exercise,  tobacco cessation     F/U  Return in about 1 year (around 11/29/2023).  Hassan Blackshire B. Ludy Messamore, PA-C 11/30/2022 3:43 PM

## 2022-11-29 ENCOUNTER — Other Ambulatory Visit: Payer: Self-pay | Admitting: Obstetrics and Gynecology

## 2022-11-29 ENCOUNTER — Encounter: Payer: Self-pay | Admitting: Obstetrics and Gynecology

## 2022-11-29 ENCOUNTER — Ambulatory Visit (INDEPENDENT_AMBULATORY_CARE_PROVIDER_SITE_OTHER): Payer: 59 | Admitting: Obstetrics and Gynecology

## 2022-11-29 VITALS — BP 104/70 | Ht 70.0 in | Wt 172.0 lb

## 2022-11-29 DIAGNOSIS — Z01419 Encounter for gynecological examination (general) (routine) without abnormal findings: Secondary | ICD-10-CM | POA: Diagnosis not present

## 2022-11-29 DIAGNOSIS — R928 Other abnormal and inconclusive findings on diagnostic imaging of breast: Secondary | ICD-10-CM

## 2022-11-29 DIAGNOSIS — N63 Unspecified lump in unspecified breast: Secondary | ICD-10-CM

## 2022-11-29 DIAGNOSIS — Z1231 Encounter for screening mammogram for malignant neoplasm of breast: Secondary | ICD-10-CM

## 2022-11-29 NOTE — Progress Notes (Signed)
I saw the patient in the office today for her annual exam and she had a couple questions about the mammo and u/s findings and report. She is hesitant to have a biopsy if it's not "really" needed. She would also be comfortable doing follow up imaging in 3-6 months if that would be an appropriate option. I told her I would follow up with you.  1. She didn't have a diagnostic mammo 06/19/18 so she wanted to make sure the correct images were being reviewed. The other dates of prior imaging are correct.  2. Patient is currently on her period and she wondered if the change in the area of concern could be attributed to this instead of a true increase in size?   Thank you for your help! Elmo Putt

## 2022-11-29 NOTE — Patient Instructions (Signed)
I value your feedback and you entrusting us with your care. If you get a [facility] patient survey, I would appreciate you taking the time to let us know about your experience today. Thank you! ? ? ?

## 2022-11-30 ENCOUNTER — Encounter: Payer: Self-pay | Admitting: Obstetrics and Gynecology

## 2022-12-04 MED ORDER — DIAZEPAM 2 MG PO TABS: 2.0000 mg | ORAL_TABLET | Freq: Four times a day (QID) | ORAL | 0 refills | Status: AC | PRN

## 2022-12-04 NOTE — Progress Notes (Signed)
Pt to schedule breast bx. Is anxious, wants Rx valium. Has Rx xanax on file that doesn't work.

## 2022-12-12 ENCOUNTER — Ambulatory Visit
Admission: RE | Admit: 2022-12-12 | Discharge: 2022-12-12 | Disposition: A | Discharge status: Home / Self Care | Payer: 59 | Source: Ambulatory Visit | Attending: Obstetrics and Gynecology | Admitting: Obstetrics and Gynecology

## 2022-12-12 DIAGNOSIS — R928 Other abnormal and inconclusive findings on diagnostic imaging of breast: Secondary | ICD-10-CM

## 2022-12-12 DIAGNOSIS — N63 Unspecified lump in unspecified breast: Secondary | ICD-10-CM

## 2022-12-12 DIAGNOSIS — N6341 Unspecified lump in right breast, subareolar: Secondary | ICD-10-CM | POA: Diagnosis not present

## 2022-12-12 DIAGNOSIS — D241 Benign neoplasm of right breast: Secondary | ICD-10-CM | POA: Diagnosis not present

## 2022-12-12 HISTORY — PX: BREAST BIOPSY: SHX20

## 2022-12-12 MED ORDER — LIDOCAINE HCL (PF) 1 % IJ SOLN
5.0000 mL | Freq: Once | INTRAMUSCULAR | Status: AC
  Administered 2022-12-12: 5 mL via INTRADERMAL
  Filled 2022-12-12: qty 5

## 2022-12-12 MED ORDER — LIDOCAINE-EPINEPHRINE 1 %-1:100000 IJ SOLN
10.0000 mL | Freq: Once | INTRAMUSCULAR | Status: AC
  Administered 2022-12-12: 10 mL via INTRADERMAL

## 2022-12-13 LAB — SURGICAL PATHOLOGY

## 2022-12-18 ENCOUNTER — Encounter: Payer: Self-pay | Admitting: Internal Medicine

## 2022-12-18 ENCOUNTER — Ambulatory Visit (INDEPENDENT_AMBULATORY_CARE_PROVIDER_SITE_OTHER): Payer: 59 | Admitting: Internal Medicine

## 2022-12-18 VITALS — BP 118/72 | HR 80 | Temp 96.8°F | Ht 70.0 in | Wt 171.0 lb

## 2022-12-18 DIAGNOSIS — E7800 Pure hypercholesterolemia, unspecified: Secondary | ICD-10-CM | POA: Insufficient documentation

## 2022-12-18 DIAGNOSIS — R7309 Other abnormal glucose: Secondary | ICD-10-CM | POA: Diagnosis not present

## 2022-12-18 NOTE — Patient Instructions (Signed)

## 2022-12-18 NOTE — Progress Notes (Signed)
Subjective:    Patient ID: Julie Jimenez, female    DOB: 1981-06-11, 42 y.o.   MRN: [id number]  HPI  Patient presents to clinic today for her annual exam.  Flu: 05/2021 Tetanus: 06/2016 COVID: Pfizer x 3 Pap smear: 09/2021 Mammogram: 10/2022 Vision screening: as needed Dentist: biannually  Diet: She does eat meat. She consumes fruits and veggies. She tries to avoid fried foods. She drinks mostly water, coffee and protein shakes Exercise: Gym 3 x week, [employer], walking  Review of Systems     Past Medical History:  Diagnosis Date   Anxiety    Attention deficit disorder (ADD)    Breast cyst, right    Hemorrhoids    History of shingles 2013   LGSIL on Pap smear of cervix 02/17/2003    Current Outpatient Medications  Medication Sig Dispense Refill   ALPRAZolam (XANAX) 0.5 MG tablet Take 1 tablet (0.5 mg total) by mouth daily as needed for anxiety. 15 tablet 0   Ascorbic Acid (VITAMIN C) 1000 MG tablet Take 1,000 mg by mouth daily.     buPROPion (WELLBUTRIN) 75 MG tablet Take 1 tablet (75 mg total) by mouth 2 (two) times daily. 180 tablet 0   Cholecalciferol (VITAMIN D-3) 25 MCG (1000 UT) CAPS Take by mouth.     ELDERBERRY PO Take by mouth.     magnesium 30 MG tablet Take 30 mg by mouth 2 (two) times daily.     Menaquinone-7 (VITAMIN K2 PO) Take by mouth.     Omega-3 1000 MG CAPS Take by mouth.     praziquantel (BILTRICIDE) 600 MG tablet Take 600 mg by mouth 3 (three) times daily. (Patient not taking: Reported on 11/29/2022)     valACYclovir (VALTREX) 500 MG tablet Take by mouth.     vitamin B-12 (CYANOCOBALAMIN) 1000 MCG tablet Take 1,000 mcg by mouth daily.     Zinc 30 MG TABS Take by mouth.     No current facility-administered medications for this visit.    Allergies  Allergen Reactions   Augmentin [Amoxicillin-Pot Clavulanate] Rash   Codeine Nausea And Vomiting and Rash    Family History  Problem Relation Age of Onset   Parkinson's disease Father    Heart disease  Paternal Grandmother    Heart disease Paternal Grandfather    Breast cancer Neg Hx    Colon cancer Neg Hx     Social History   Socioeconomic History   Marital status: Single    Spouse name: Not on file   Number of children: Not on file   Years of education: Not on file   Highest education level: Not on file  Occupational History   Not on file  Tobacco Use   Smoking status: Some Days    Packs/day: 0.25    Years: 10.00    Additional pack years: 0.00    Total pack years: 2.50    Types: Cigarettes   Smokeless tobacco: Never  Vaping Use   Vaping Use: Never used  Substance and Sexual Activity   Alcohol use: Yes    Alcohol/week: 8.0 standard drinks of alcohol    Types: 8 Glasses of wine per week    Comment: moderate   Drug use: No   Sexual activity: Yes    Birth control/protection: None  Other Topics Concern   Not on file  Social History Narrative   Not on file   Social Determinants of Health   Financial Resource Strain: Not on file  Food Insecurity: Not on file  Transportation Needs: Not on file  Physical Activity: Sufficiently Active (10/02/2017)   Exercise Vital Sign    Days of Exercise per Week: 5 days    Minutes of Exercise per Session: 60 min  Stress: Stress Concern Present (10/02/2017)   [place]    Feeling of Stress : Rather much  Social Connections: Socially Integrated (10/02/2017)   Social Connection and Isolation Panel [NHANES]    Frequency of Communication with Friends and Family: More than three times a week    Frequency of Social Gatherings with Friends and Family: Three times a week    Attends Religious Services: More than 4 times per year    Active Member of Clubs or Organizations: Yes    Attends Archivist Meetings: More than 4 times per year    Marital Status: Living with partner  Intimate Partner Violence: Not At Risk (10/02/2017)   Humiliation, Afraid, Rape, and Kick  questionnaire    Fear of Current or Ex-Partner: No    Emotionally Abused: No    Physically Abused: No    Sexually Abused: No     Constitutional: Denies fever, malaise, fatigue, headache or abrupt weight changes.  HEENT: Denies eye pain, eye redness, ear pain, ringing in the ears, wax buildup, runny nose, nasal congestion, bloody nose, or sore throat. Respiratory: Denies difficulty breathing, shortness of breath, cough or sputum production.   Cardiovascular: Denies chest pain, chest tightness, palpitations or swelling in the hands or feet.  Gastrointestinal: Denies abdominal pain, bloating, constipation, diarrhea or blood in the stool.  GU: Denies urgency, frequency, pain with urination, burning sensation, blood in urine, odor or discharge. Musculoskeletal: Denies decrease in range of motion, difficulty with gait, muscle pain or joint pain and swelling.  Skin: Denies redness, rashes, lesions or ulcercations.  Neurological: Patient reports inattention and insomnia.  Denies dizziness, difficulty with memory, difficulty with speech or problems with balance and coordination.  Psych: Patient has a history of anxiety.  Denies depression, SI/HI.  No other specific complaints in a complete review of systems (except as listed in HPI above).  Objective:   Physical Exam  BP 118/72 (BP Location: Right Arm, Patient Position: Sitting, Cuff Size: Normal)   Pulse 80   Temp (!) 96.8 F (36 C) (Temporal)   Ht 5\' 10"  (1.778 m)   Wt 171 lb (77.6 kg)   LMP 11/26/2022 (Exact Date)   SpO2 99%   BMI 24.54 kg/m   Wt Readings from Last 3 Encounters:  11/29/22 172 lb (78 kg)  10/31/22 171 lb (77.6 kg)  10/01/22 171 lb (77.6 kg)    General: Appears her stated age, well developed, well nourished in NAD. Skin: Warm, dry and intact.  HEENT: Head: normal shape and size; Eyes: sclera white, no icterus, conjunctiva pink, PERRLA and EOMs intact;  Neck:  Neck supple, trachea midline. No masses, lumps or  thyromegaly present.  Cardiovascular: Normal rate and rhythm. S1,S2 noted.  No murmur, rubs or gallops noted. No JVD or BLE edema.  Pulmonary/Chest: Normal effort and positive vesicular breath sounds. No respiratory distress. No wheezes, rales or ronchi noted.  Abdomen: Normal bowel sounds.  Musculoskeletal: Strength 5/5 BUE/BLE.  No difficulty with gait.  Neurological: Alert and oriented. Cranial nerves II-XII grossly intact. Coordination normal.  Psychiatric: Mood and affect normal. Behavior is normal. Judgment and thought content normal.     BMET    Component Value Date/Time  NA 141 10/01/2022 0947   NA 141 05/27/2014 0122   K 4.6 10/01/2022 0947   K 3.5 05/27/2014 0122   CL 108 10/01/2022 0947   CL 107 05/27/2014 0122   CO2 26 10/01/2022 0947   CO2 24 05/27/2014 0122   GLUCOSE 79 10/01/2022 0947   GLUCOSE 96 05/27/2014 0122   BUN 6 (L) 10/01/2022 0947   BUN 9 05/27/2014 0122   CREATININE 0.60 10/01/2022 0947   CALCIUM 9.1 10/01/2022 0947   CALCIUM 8.9 05/27/2014 0122   GFRNONAA >60 05/27/2014 0122   GFRAA >60 05/27/2014 0122    Lipid Panel     Component Value Date/Time   CHOL 188 12/14/2021 1342   TRIG 40 12/14/2021 1342   HDL 62 12/14/2021 1342   CHOLHDL 3.0 12/14/2021 1342   VLDL 9.4 12/12/2020 1047   LDLCALC 114 (H) 12/14/2021 1342    CBC    Component Value Date/Time   WBC 12.3 (H) 10/01/2022 0947   RBC 4.66 10/01/2022 0947   HGB 14.6 10/01/2022 0947   HGB 16.5 (H) 05/27/2014 0122   HCT 42.3 10/01/2022 0947   HCT 51.0 (H) 05/27/2014 0122   PLT 375 10/01/2022 0947   PLT 352 05/27/2014 0122   MCV 90.8 10/01/2022 0947   MCV 96 05/27/2014 0122   MCH 31.3 10/01/2022 0947   MCHC 34.5 10/01/2022 0947   RDW 11.7 10/01/2022 0947   RDW 12.6 05/27/2014 0122   LYMPHSABS 3.8 (H) 05/27/2014 0122   MONOABS 0.7 05/27/2014 0122   EOSABS 0.3 05/27/2014 0122   BASOSABS 0.1 05/27/2014 0122    Hgb A1C Lab Results  Component Value Date   HGBA1C 5.1 07/08/2015            Assessment & Plan:   Preventative Health Maintenance:  Encouraged her to get a flu shot in the fall Tetanus UTD Encouraged her to get her COVID booster Pap smear UTD Mammogram UTD Encouraged her to consume a balanced diet and exercise regimen Advised her to see an eye doctor and dentist annually We will check CBC, c-Met, lipid, A1c today  RTC in 6 months, follow-up chronic conditions Webb Silversmith, NP

## 2022-12-19 ENCOUNTER — Encounter: Payer: Self-pay | Admitting: Internal Medicine

## 2022-12-19 LAB — COMPLETE METABOLIC PANEL WITH GFR
AG Ratio: 2.5 (calc) (ref 1.0–2.5)
ALT: 13 U/L (ref 6–29)
AST: 18 U/L (ref 10–30)
Albumin: 4.2 g/dL (ref 3.6–5.1)
Alkaline phosphatase (APISO): 41 U/L (ref 31–125)
BUN: 14 mg/dL (ref 7–25)
CO2: 25 mmol/L (ref 20–32)
Calcium: 9.1 mg/dL (ref 8.6–10.2)
Chloride: 105 mmol/L (ref 98–110)
Creat: 0.76 mg/dL (ref 0.50–0.99)
Globulin: 1.7 g/dL (calc) — ABNORMAL LOW (ref 1.9–3.7)
Glucose, Bld: 83 mg/dL (ref 65–99)
Potassium: 4.6 mmol/L (ref 3.5–5.3)
Sodium: 137 mmol/L (ref 135–146)
Total Bilirubin: 0.9 mg/dL (ref 0.2–1.2)
Total Protein: 5.9 g/dL — ABNORMAL LOW (ref 6.1–8.1)
eGFR: 101 mL/min/{1.73_m2} (ref >=60)

## 2022-12-19 LAB — CBC
HCT: 44.1 % (ref 35.0–45.0)
Hemoglobin: 14.5 g/dL (ref 11.7–15.5)
MCH: 30.8 pg (ref 27.0–33.0)
MCHC: 32.9 g/dL (ref 32.0–36.0)
MCV: 93.6 fL (ref 80.0–100.0)
MPV: 9.9 fL (ref 7.5–12.5)
Platelets: 277 10*3/uL (ref 140–400)
RBC: 4.71 10*6/uL (ref 3.80–5.10)
RDW: 12.5 % (ref 11.0–15.0)
WBC: 6.7 10*3/uL (ref 3.8–10.8)

## 2022-12-19 LAB — HEMOGLOBIN A1C
Hgb A1c MFr Bld: 4.9 % of total Hgb (ref <5.7)
Mean Plasma Glucose: 94 mg/dL
eAG (mmol/L): 5.2 mmol/L

## 2022-12-19 LAB — LIPID PANEL
Cholesterol: 178 mg/dL (ref <200)
HDL: 64 mg/dL (ref >=50)
LDL Cholesterol (Calc): 102 mg/dL (calc) — ABNORMAL HIGH
Non-HDL Cholesterol (Calc): 114 mg/dL (calc) (ref <130)
Total CHOL/HDL Ratio: 2.8 (calc) (ref <5.0)
Triglycerides: 42 mg/dL (ref <150)

## 2023-02-23 ENCOUNTER — Telehealth: Payer: 59 | Admitting: Physician Assistant

## 2023-02-23 DIAGNOSIS — J019 Acute sinusitis, unspecified: Secondary | ICD-10-CM | POA: Diagnosis not present

## 2023-02-23 DIAGNOSIS — B9789 Other viral agents as the cause of diseases classified elsewhere: Secondary | ICD-10-CM | POA: Diagnosis not present

## 2023-02-23 MED ORDER — FLUTICASONE PROPIONATE 50 MCG/ACT NA SUSP: 2.0000 | Freq: Every day | NASAL | 6 refills | Status: DC

## 2023-02-23 NOTE — Progress Notes (Signed)
E-Visit for Sinus Problems  We are sorry that you are not feeling well.  Here is how we plan to help!  Based on what you have shared with me it looks like you have sinusitis.  Sinusitis is inflammation and infection in the sinus cavities of the head.  Based on your presentation I believe you most likely have Acute Viral Sinusitis.This is an infection most likely caused by a virus. There is not specific treatment for viral sinusitis other than to help you with the symptoms until the infection runs its course.  You may use an oral decongestant such as Mucinex D or if you have glaucoma or high blood pressure use plain Mucinex. Saline nasal spray help and can safely be used as often as needed for congestion, I have prescribed: Fluticasone nasal spray two sprays in each nostril once a day  Some authorities believe that zinc sprays or the use of Echinacea may shorten the course of your symptoms.  Sinus infections are not as easily transmitted as other respiratory infection, however we still recommend that you avoid close contact with loved ones, especially the very young and elderly.  Remember to wash your hands thoroughly throughout the day as this is the number one way to prevent the spread of infection!  Home Care: Only take medications as instructed by your medical team. Do not take these medications with alcohol. A steam or ultrasonic humidifier can help congestion.  You can place a towel over your head and breathe in the steam from hot water coming from a faucet. Avoid close contacts especially the very young and the elderly. Cover your mouth when you cough or sneeze. Always remember to wash your hands.  Get Help Right Away If: You develop worsening fever or sinus pain. You develop a severe head ache or visual changes. Your symptoms persist after you have completed your treatment plan.  Make sure you Understand these instructions. Will watch your condition. Will get help right away if you  are not doing well or get worse.   Thank you for choosing an e-visit.  Your e-visit answers were reviewed by a board certified advanced clinical practitioner to complete your personal care plan. Depending upon the condition, your plan could have included both over the counter or prescription medications.  Please review your pharmacy choice. Make sure the pharmacy is open so you can pick up prescription now. If there is a problem, you may contact your provider through MyChart messaging and have the prescription routed to another pharmacy.  Your safety is important to us. If you have drug allergies check your prescription carefully.   For the next 24 hours you can use MyChart to ask questions about today's visit, request a non-urgent call back, or ask for a work or school excuse. You will get an email in the next two days asking about your experience. I hope that your e-visit has been valuable and will speed your recovery.  I have spent 5 minutes in review of e-visit questionnaire, review and updating patient chart, medical decision making and response to patient.   Samul Mcinroy Z Ward, PA-C      

## 2023-03-10 ENCOUNTER — Telehealth: Payer: 59 | Admitting: Family Medicine

## 2023-03-10 DIAGNOSIS — N3000 Acute cystitis without hematuria: Secondary | ICD-10-CM

## 2023-03-10 MED ORDER — NITROFURANTOIN MONOHYD MACRO 100 MG PO CAPS: 100.0000 mg | ORAL_CAPSULE | Freq: Two times a day (BID) | ORAL | 0 refills | Status: AC

## 2023-03-10 NOTE — Progress Notes (Signed)
Virtual Visit Consent   Julie Jimenez, you are scheduled for a virtual visit with a [facility] provider today. Just as with appointments in the office, your consent must be obtained to participate. Your consent will be active for this visit and any virtual visit you may have with one of our providers in the next 365 days. If you have a MyChart account, a copy of this consent can be sent to you electronically.  As this is a virtual visit, video technology does not allow for your provider to perform a traditional examination. This may limit your provider's ability to fully assess your condition. If your provider identifies any concerns that need to be evaluated in person or the need to arrange testing (such as labs, EKG, etc.), we will make arrangements to do so. Although advances in technology are sophisticated, we cannot ensure that it will always work on either your end or our end. If the connection with a video visit is poor, the visit may have to be switched to a telephone visit. With either a video or telephone visit, we are not always able to ensure that we have a secure connection.  By engaging in this virtual visit, you consent to the provision of healthcare and authorize for your insurance to be billed (if applicable) for the services provided during this visit. Depending on your insurance coverage, you may receive a charge related to this service.  I need to obtain your verbal consent now. Are you willing to proceed with your visit today? Julie Jimenez has provided verbal consent on 03/10/2023 for a virtual visit (video or telephone). Georgana Curio, FNP  Date: 03/10/2023 11:13 AM  Virtual Visit via Video Note   I, Georgana Curio, connected with  ERVINA Jimenez  ([id number], 05/23/1981) on 03/10/23 at  9:15 AM EDT by a video-enabled telemedicine application and verified that I am speaking with the correct person using two identifiers.  Location: Patient: Virtual Visit Location Patient: Home Provider:  Virtual Visit Location Provider: Home Office   I discussed the limitations of evaluation and management by telemedicine and the availability of in person appointments. The patient expressed understanding and agreed to proceed.    History of Present Illness: Julie Jimenez is a 42 y.o. who identifies as a female who was assigned female at birth, and is being seen today for dysuria, frequency for several days worsening. No fever. Marland Kitchen  HPI: HPI  Problems:  Patient Active Problem List   Diagnosis Date Noted   Pure hypercholesterolemia 12/18/2022   Insomnia 12/14/2021   ADHD (attention deficit hyperactivity disorder) 11/12/2013   Generalized anxiety disorder 11/12/2013    Allergies:  Allergies  Allergen Reactions   Augmentin [Amoxicillin-Pot Clavulanate] Rash   Codeine Nausea And Vomiting and Rash   Medications:  Current Outpatient Medications:    ALPRAZolam (XANAX) 0.5 MG tablet, Take 1 tablet (0.5 mg total) by mouth daily as needed for anxiety., Disp: 15 tablet, Rfl: 0   Ascorbic Acid (VITAMIN C) 1000 MG tablet, Take 1,000 mg by mouth daily., Disp: , Rfl:    buPROPion (WELLBUTRIN) 75 MG tablet, Take 1 tablet (75 mg total) by mouth 2 (two) times daily., Disp: 180 tablet, Rfl: 0   Cholecalciferol (VITAMIN D-3) 25 MCG (1000 UT) CAPS, Take by mouth., Disp: , Rfl:    ELDERBERRY PO, Take by mouth., Disp: , Rfl:    fluticasone (FLONASE) 50 MCG/ACT nasal spray, Place 2 sprays into both nostrils daily., Disp: 16 g, Rfl: 6  magnesium 30 MG tablet, Take 30 mg by mouth 2 (two) times daily., Disp: , Rfl:    Menaquinone-7 (VITAMIN K2 PO), Take by mouth., Disp: , Rfl:    Omega-3 1000 MG CAPS, Take by mouth., Disp: , Rfl:    valACYclovir (VALTREX) 500 MG tablet, Take by mouth., Disp: , Rfl:    vitamin B-12 (CYANOCOBALAMIN) 1000 MCG tablet, Take 1,000 mcg by mouth daily., Disp: , Rfl:    Zinc 30 MG TABS, Take by mouth., Disp: , Rfl:   Observations/Objective: Patient is well-developed, well-nourished  in no acute distress.  Resting comfortably  at home.  Head is normocephalic, atraumatic.  No labored breathing.  Speech is clear and coherent with logical content.  Patient is alert and oriented at baseline.    Assessment and Plan: 1. Acute cystitis without hematuria  Increase fluids, UC if sx persist or worsen.   Follow Up Instructions: I discussed the assessment and treatment plan with the patient. The patient was provided an opportunity to ask questions and all were answered. The patient agreed with the plan and demonstrated an understanding of the instructions.  A copy of instructions were sent to the patient via MyChart unless otherwise noted below.     The patient was advised to call back or seek an in-person evaluation if the symptoms worsen or if the condition fails to improve as anticipated.  Time:  I spent 8 minutes with the patient via telehealth technology discussing the above problems/concerns.    Georgana Curio, FNP

## 2023-03-10 NOTE — Patient Instructions (Signed)
Urinary Tract Infection, Adult  A urinary tract infection (UTI) is an infection of any part of the urinary tract. The urinary tract includes the kidneys, ureters, bladder, and urethra. These organs make, store, and get rid of urine in the body. An upper UTI affects the ureters and kidneys. A lower UTI affects the bladder and urethra. What are the causes? Most urinary tract infections are caused by bacteria in your genital area around your urethra, where urine leaves your body. These bacteria grow and cause inflammation of your urinary tract. What increases the risk? You are more likely to develop this condition if: You have a urinary catheter that stays in place. You are not able to control when you urinate or have a bowel movement (incontinence). You are female and you: Use a spermicide or diaphragm for birth control. Have low estrogen levels. Are pregnant. You have certain genes that increase your risk. You are sexually active. You take antibiotic medicines. You have a condition that causes your flow of urine to slow down, such as: An enlarged prostate, if you are female. Blockage in your urethra. A kidney stone. A nerve condition that affects your bladder control (neurogenic bladder). Not getting enough to drink, or not urinating often. You have certain medical conditions, such as: Diabetes. A weak disease-fighting system (immunesystem). Sickle cell disease. Gout. Spinal cord injury. What are the signs or symptoms? Symptoms of this condition include: Needing to urinate right away (urgency). Frequent urination. This may include small amounts of urine each time you urinate. Pain or burning with urination. Blood in the urine. Urine that smells bad or unusual. Trouble urinating. Cloudy urine. Vaginal discharge, if you are female. Pain in the abdomen or the lower back. You may also have: Vomiting or a decreased appetite. Confusion. Irritability or tiredness. A fever or  chills. Diarrhea. The first symptom in older adults may be confusion. In some cases, they may not have any symptoms until the infection has worsened. How is this diagnosed? This condition is diagnosed based on your medical history and a physical exam. You may also have other tests, including: Urine tests. Blood tests. Tests for STIs (sexually transmitted infections). If you have had more than one UTI, a cystoscopy or imaging studies may be done to determine the cause of the infections. How is this treated? Treatment for this condition includes: Antibiotic medicine. Over-the-counter medicines to treat discomfort. Drinking enough water to stay hydrated. If you have frequent infections or have other conditions such as a kidney stone, you may need to see a health care provider who specializes in the urinary tract (urologist). In rare cases, urinary tract infections can cause sepsis. Sepsis is a life-threatening condition that occurs when the body responds to an infection. Sepsis is treated in the hospital with IV antibiotics, fluids, and other medicines. Follow these instructions at home:  Medicines Take over-the-counter and prescription medicines only as told by your health care provider. If you were prescribed an antibiotic medicine, take it as told by your health care provider. Do not stop using the antibiotic even if you start to feel better. General instructions Make sure you: Empty your bladder often and completely. Do not hold urine for long periods of time. Empty your bladder after sex. Wipe from front to back after urinating or having a bowel movement if you are female. Use each tissue only one time when you wipe. Drink enough fluid to keep your urine pale yellow. Keep all follow-up visits. This is important. Contact a health   care provider if: Your symptoms do not get better after 1-2 days. Your symptoms go away and then return. Get help right away if: You have severe pain in  your back or your lower abdomen. You have a fever or chills. You have nausea or vomiting. Summary A urinary tract infection (UTI) is an infection of any part of the urinary tract, which includes the kidneys, ureters, bladder, and urethra. Most urinary tract infections are caused by bacteria in your genital area. Treatment for this condition often includes antibiotic medicines. If you were prescribed an antibiotic medicine, take it as told by your health care provider. Do not stop using the antibiotic even if you start to feel better. Keep all follow-up visits. This is important. This information is not intended to replace advice given to you by your health care provider. Make sure you discuss any questions you have with your health care provider. Document Revised: 04/17/2020 Document Reviewed: 04/22/2020 Elsevier Patient Education  2024 [employer]  

## 2023-06-21 ENCOUNTER — Encounter: Payer: Self-pay | Admitting: Internal Medicine

## 2023-06-25 ENCOUNTER — Ambulatory Visit: Payer: 59 | Admitting: Internal Medicine

## 2023-07-07 ENCOUNTER — Encounter: Payer: Self-pay | Admitting: Obstetrics and Gynecology

## 2023-07-16 DIAGNOSIS — L821 Other seborrheic keratosis: Secondary | ICD-10-CM | POA: Diagnosis not present

## 2023-07-16 DIAGNOSIS — D225 Melanocytic nevi of trunk: Secondary | ICD-10-CM | POA: Diagnosis not present

## 2023-07-16 DIAGNOSIS — D2262 Melanocytic nevi of left upper limb, including shoulder: Secondary | ICD-10-CM | POA: Diagnosis not present

## 2023-07-16 DIAGNOSIS — D2272 Melanocytic nevi of left lower limb, including hip: Secondary | ICD-10-CM | POA: Diagnosis not present

## 2023-07-16 DIAGNOSIS — D2261 Melanocytic nevi of right upper limb, including shoulder: Secondary | ICD-10-CM | POA: Diagnosis not present

## 2023-07-16 DIAGNOSIS — L814 Other melanin hyperpigmentation: Secondary | ICD-10-CM | POA: Diagnosis not present

## 2023-07-16 DIAGNOSIS — D2271 Melanocytic nevi of right lower limb, including hip: Secondary | ICD-10-CM | POA: Diagnosis not present

## 2023-07-16 DIAGNOSIS — B001 Herpesviral vesicular dermatitis: Secondary | ICD-10-CM | POA: Diagnosis not present

## 2023-07-16 DIAGNOSIS — X32XXXA Exposure to sunlight, initial encounter: Secondary | ICD-10-CM | POA: Diagnosis not present

## 2023-07-25 ENCOUNTER — Other Ambulatory Visit: Payer: Self-pay | Admitting: Internal Medicine

## 2023-07-25 MED ORDER — BUPROPION HCL 75 MG PO TABS: 75.0000 mg | ORAL_TABLET | Freq: Two times a day (BID) | ORAL | 0 refills | Status: DC

## 2023-08-13 ENCOUNTER — Encounter: Payer: Self-pay | Admitting: Internal Medicine

## 2023-08-13 ENCOUNTER — Ambulatory Visit: Payer: 59 | Admitting: Internal Medicine

## 2023-08-13 VITALS — BP 120/80 | HR 80 | Resp 18 | Wt 171.0 lb

## 2023-08-13 DIAGNOSIS — F17210 Nicotine dependence, cigarettes, uncomplicated: Secondary | ICD-10-CM | POA: Diagnosis not present

## 2023-08-13 DIAGNOSIS — S6991XA Unspecified injury of right wrist, hand and finger(s), initial encounter: Secondary | ICD-10-CM | POA: Diagnosis not present

## 2023-08-13 NOTE — Progress Notes (Signed)
Subjective:    Patient ID: Julie Jimenez, female    DOB: 1981-05-15, 42 y.o.   MRN: [id number]  HPI Discussed the use of AI scribe software for clinical note transcription with the patient, who gave verbal consent to proceed.   The patient presents with a chief complaint of a finger injury sustained by smashing it in a corridor. The patient reports that the finger was initially more blue, but the color has improved. However, she notes the development of a new bump on the finger. The patient describes the sensation in the finger as "nummy" but is able to bend it. She expresses concern about the possibility of a broken bone under the nail and the nail falling off. The patient also reports that the nail feels loose when a bandaid is applied and removed.  In addition to the finger injury, the patient discusses her efforts to quit smoking. She mentions using nicotine patches and taking medication to aid in this process. The patient also reports experiencing changes in bowel movements and a decrease in sexual desire, which she attributes to the medication. She expresses a desire to be healthy, citing family history of lung cancer and small cell cancer related to smoking.   Review of Systems     Past Medical History:  Diagnosis Date   Anxiety    Attention deficit disorder (ADD)    Breast cyst, right    Hemorrhoids    History of shingles 2013   LGSIL on Pap smear of cervix 02/17/2003    Current Outpatient Medications  Medication Sig Dispense Refill   ALPRAZolam (XANAX) 0.5 MG tablet Take 1 tablet (0.5 mg total) by mouth daily as needed for anxiety. 15 tablet 0   Ascorbic Acid (VITAMIN C) 1000 MG tablet Take 1,000 mg by mouth daily.     buPROPion (WELLBUTRIN) 75 MG tablet Take 1 tablet (75 mg total) by mouth 2 (two) times daily. 180 tablet 0   Cholecalciferol (VITAMIN D-3) 25 MCG (1000 UT) CAPS Take by mouth.     ELDERBERRY PO Take by mouth.     fluticasone (FLONASE) 50 MCG/ACT nasal spray  Place 2 sprays into both nostrils daily. 16 g 6   magnesium 30 MG tablet Take 30 mg by mouth 2 (two) times daily.     Menaquinone-7 (VITAMIN K2 PO) Take by mouth.     Omega-3 1000 MG CAPS Take by mouth.     valACYclovir (VALTREX) 500 MG tablet Take by mouth.     vitamin B-12 (CYANOCOBALAMIN) 1000 MCG tablet Take 1,000 mcg by mouth daily.     Zinc 30 MG TABS Take by mouth.     No current facility-administered medications for this visit.    Allergies  Allergen Reactions   Augmentin [Amoxicillin-Pot Clavulanate] Rash   Codeine Nausea And Vomiting and Rash    Family History  Problem Relation Age of Onset   Parkinson's disease Father    Heart disease Paternal Grandmother    Heart disease Paternal Grandfather    Breast cancer Neg Hx    Colon cancer Neg Hx     Social History   Socioeconomic History   Marital status: Single    Spouse name: Not on file   Number of children: Not on file   Years of education: Not on file   Highest education level: Not on file  Occupational History   Not on file  Tobacco Use   Smoking status: Some Days    Current packs/day: 0.25  Average packs/day: 0.3 packs/day for 10.0 years (2.5 ttl pk-yrs)    Types: Cigarettes   Smokeless tobacco: Never  Vaping Use   Vaping status: Never Used  Substance and Sexual Activity   Alcohol use: Yes    Alcohol/week: 8.0 standard drinks of alcohol    Types: 8 Glasses of wine per week    Comment: moderate   Drug use: No   Sexual activity: Yes    Birth control/protection: None  Other Topics Concern   Not on file  Social History Narrative   Not on file   Social Determinants of Health   Financial Resource Strain: Not on file  Food Insecurity: Not on file  Transportation Needs: Not on file  Physical Activity: Sufficiently Active (10/02/2017)   Exercise Vital Sign    Days of Exercise per Week: 5 days    Minutes of Exercise per Session: 60 min  Stress: Stress Concern Present (10/02/2017)   [employer]  of Occupational Health - Occupational Stress Questionnaire    Feeling of Stress : Rather much  Social Connections: Socially Integrated (10/02/2017)   Social Connection and Isolation Panel [NHANES]    Frequency of Communication with Friends and Family: More than three times a week    Frequency of Social Gatherings with Friends and Family: Three times a week    Attends Religious Services: More than 4 times per year    Active Member of Clubs or Organizations: Yes    Attends Banker Meetings: More than 4 times per year    Marital Status: Living with partner  Intimate Partner Violence: Not At Risk (10/02/2017)   Humiliation, Afraid, Rape, and Kick questionnaire    Fear of Current or Ex-Partner: No    Emotionally Abused: No    Physically Abused: No    Sexually Abused: No     Constitutional: Denies fever, malaise, fatigue, headache or abrupt weight changes.  Respiratory: Denies difficulty breathing, shortness of breath, cough or sputum production.   Cardiovascular: Denies chest pain, chest tightness, palpitations or swelling in the hands or feet.  Musculoskeletal: Patient reports right ring finger pain and swelling.  Denies decrease in range of motion, difficulty with gait, muscle pain  Skin: Pt reports bruising of right ring finger. Denies redness, rashes, lesions or ulcercations.  Neurological: Patient reports inattention, numbness right ring finger.  Denies dizziness, difficulty with memory, difficulty with speech or problems with balance and coordination.  Psych: Patient has a history of anxiety.  Denies depression, SI/HI.  No other specific complaints in a complete review of systems (except as listed in HPI above).  Objective:   Physical Exam  There were no vitals taken for this visit.  Wt Readings from Last 3 Encounters:  12/18/22 171 lb (77.6 kg)  11/29/22 172 lb (78 kg)  10/31/22 171 lb (77.6 kg)    General: Appears her stated age, well developed, well nourished in  NAD. Skin: Warm, dry and intact.  Bruising noted of the distal phalanx of the right ring finger.  It appears the nail is pushed up at the nailbed. Cardiovascular: Normal rate and rhythm. S1,S2 noted.  No murmur, rubs or gallops noted. No JVD or BLE edema.  Pulmonary/Chest: Normal effort and positive vesicular breath sounds. No respiratory distress. No wheezes, rales or ronchi noted.  Musculoskeletal: Normal flexion extension of the right ring finger.  1+ swelling noted at the tip of the right ring finger.  No difficulty with gait.  Neurological: Alert and oriented.  Sensation intact  but decreased to the right Reminyl.    BMET    Component Value Date/Time   NA 137 12/18/2022 0838   NA 141 05/27/2014 0122   K 4.6 12/18/2022 0838   K 3.5 05/27/2014 0122   CL 105 12/18/2022 0838   CL 107 05/27/2014 0122   CO2 25 12/18/2022 0838   CO2 24 05/27/2014 0122   GLUCOSE 83 12/18/2022 0838   GLUCOSE 96 05/27/2014 0122   BUN 14 12/18/2022 0838   BUN 9 05/27/2014 0122   CREATININE 0.76 12/18/2022 0838   CALCIUM 9.1 12/18/2022 0838   CALCIUM 8.9 05/27/2014 0122   GFRNONAA >60 05/27/2014 0122   GFRAA >60 05/27/2014 0122    Lipid Panel     Component Value Date/Time   CHOL 178 12/18/2022 0838   TRIG 42 12/18/2022 0838   HDL 64 12/18/2022 0838   CHOLHDL 2.8 12/18/2022 0838   VLDL 9.4 12/12/2020 1047   LDLCALC 102 (H) 12/18/2022 0838    CBC    Component Value Date/Time   WBC 6.7 12/18/2022 0838   RBC 4.71 12/18/2022 0838   HGB 14.5 12/18/2022 0838   HGB 16.5 (H) 05/27/2014 0122   HCT 44.1 12/18/2022 0838   HCT 51.0 (H) 05/27/2014 0122   PLT 277 12/18/2022 0838   PLT 352 05/27/2014 0122   MCV 93.6 12/18/2022 0838   MCV 96 05/27/2014 0122   MCH 30.8 12/18/2022 0838   MCHC 32.9 12/18/2022 0838   RDW 12.5 12/18/2022 0838   RDW 12.6 05/27/2014 0122   LYMPHSABS 3.8 (H) 05/27/2014 0122   MONOABS 0.7 05/27/2014 0122   EOSABS 0.3 05/27/2014 0122   BASOSABS 0.1 05/27/2014 0122     Hgb A1C Lab Results  Component Value Date   HGBA1C 4.9 12/18/2022            Assessment & Plan:  Assessment and Plan    Finger Trauma Finger smashed in door with subsequent bruising and swelling. Possible fracture of the distal phalanx, but no intervention required. Nail bed disruption noted with expectation that the nail will fall off. -Continue with self-care including bandaging during work to prevent snagging. -Apply Polysporin, particularly to the disrupted nail bed area. -Allow nail to fall off naturally, or remove if not too painful.  Nicotine Dependence Currently using nicotine patches and Nicorette gum. Reports decreased cravings for alcohol and increased interest in household chores. -Continue current nicotine replacement therapy. -Encouraged to continue efforts to quit smoking, given family history of lung cancer and small cell carcinoma.        RTC in 4 months for your annual exam Nicki Reaper, NP

## 2023-08-13 NOTE — Patient Instructions (Signed)
RICE Therapy for Routine Care of Injuries Many injuries can be cared for with rest, ice, compression, and elevation. This is also called RICE therapy. RICE therapy includes: Resting the injured body part. Putting ice on the injury. Putting pressure on the injury. This is also called compression. Raising the injured part. This is also called elevation. RICE therapy can help reduce pain and swelling. Supplies needed: Ice. Plastic bag. Towel. Elastic bandage. Pillow or pillows to raise the injured body part. How to care for your injury with RICE therapy Rest Try to rest the injured part of your body. You can go back to your normal activities when your health care provider says it's okay to do them and when you can do them without pain. Ask what things are safe for you to do. Some injuries heal better with early movement instead of resting. If you rest the injury too much, it may not heal as well. Ask your provider if you should do exercises to help your injury get better. Ice Putting ice on your injury can help to lessen swelling and pain. Do not apply ice directly to your skin. Use ice on as many days as told by your provider. If told, put ice on the area. Put ice in a plastic bag. Place a towel between your skin and the bag. Leave the ice on for 20 minutes, 2-3 times a day. If your skin turns bright red, take off the ice right away to prevent skin damage. The risk of damage is higher if you can't feel pain, heat, or cold.  Compression Put pressure, also called compression, on your injured area. This can be done with an elastic bandage. If this type of bandage has been put on your injury: Follow instructions on the package the bandage came in about how to use it. Do not wrap the bandage too tightly. Wrap the bandage more loosely if part of your body beyond the bandage looks blue, or is swollen, cold, painful, or loses feeling. Take off the bandage and put it on again every 3-4 hours or  as told by your provider. Call your provider if the bandage seems to make your injury worse.  Elevation Raise the injured area above the level of your heart while you're sitting or lying down. Use a pillow to support your injured area as needed. Follow these instructions at home: If your symptoms get worse or last a long time, make a follow-up appointment with your provider. You may need to have imaging tests, such as X-rays or an MRI. If you have imaging tests, ask how to get your results when they are ready. Contact a health care provider if: You keep having pain and swelling. Your symptoms get worse. Get help right away if: You have sudden, very bad pain at your injury or lower than your injury. You have tingling or numbness at your injury or lower than your injury, and it does not go away when you take the bandage off. This information is not intended to replace advice given to you by your health care provider. Make sure you discuss any questions you have with your health care provider. Document Revised: 11/26/2022 Document Reviewed: 11/26/2022 Elsevier Patient Education  2024 [employer].

## 2023-10-01 ENCOUNTER — Other Ambulatory Visit: Payer: Self-pay | Admitting: Internal Medicine

## 2023-10-01 MED ORDER — VALACYCLOVIR HCL 500 MG PO TABS: 500.0000 mg | ORAL_TABLET | Freq: Two times a day (BID) | ORAL | 0 refills | Status: AC | PRN

## 2023-11-12 ENCOUNTER — Other Ambulatory Visit: Payer: Self-pay | Admitting: Internal Medicine

## 2023-11-13 NOTE — Telephone Encounter (Signed)
Requested Prescriptions  Pending Prescriptions Disp Refills   buPROPion (WELLBUTRIN) 75 MG tablet [Pharmacy Med Name: BUPROPION HCL 75 MG TAB] 180 tablet 0    Sig: TAKE ONE TABLET BY MOUTH TWICE DAILY     Psychiatry: Antidepressants - bupropion Passed - 11/13/2023 10:03 AM      Passed - Cr in normal range and within 360 days    Creat  Date Value Ref Range Status  12/18/2022 0.76 0.50 - 0.99 mg/dL Final         Passed - AST in normal range and within 360 days    AST  Date Value Ref Range Status  12/18/2022 18 10 - 30 U/L Final   SGOT(AST)  Date Value Ref Range Status  05/27/2014 21 15 - 37 Unit/L Final         Passed - ALT in normal range and within 360 days    ALT  Date Value Ref Range Status  12/18/2022 13 6 - 29 U/L Final   SGPT (ALT)  Date Value Ref Range Status  05/27/2014 21 U/L Final    Comment:    14-63 NOTE: New Reference Range 04/13/14          Passed - Last BP in normal range    BP Readings from Last 1 Encounters:  08/13/23 120/80         Passed - Valid encounter within last 6 months    Recent Outpatient Visits           3 months ago Injury of finger of right hand, initial encounter   [facility] [place] [place], Salvadore Oxford, NP   11 months ago Pure hypercholesterolemia   [place] [facility] [place], Salvadore Oxford, NP   1 year ago COVID-19 virus infection   [place] [facility] Smitty Cords, DO   1 year ago Other fatigue   [place] [facility], Salvadore Oxford, NP   1 year ago Encounter for general adult medical examination with abnormal findings   [place] [facility], Salvadore Oxford, NP       Future Appointments             In 1 month Baity, Salvadore Oxford, NP [place] [facility], [facility]

## 2023-11-15 DIAGNOSIS — L820 Inflamed seborrheic keratosis: Secondary | ICD-10-CM | POA: Diagnosis not present

## 2023-11-15 DIAGNOSIS — L538 Other specified erythematous conditions: Secondary | ICD-10-CM | POA: Diagnosis not present

## 2023-11-15 DIAGNOSIS — L2989 Other pruritus: Secondary | ICD-10-CM | POA: Diagnosis not present

## 2023-11-15 DIAGNOSIS — B001 Herpesviral vesicular dermatitis: Secondary | ICD-10-CM | POA: Diagnosis not present

## 2023-11-15 DIAGNOSIS — R208 Other disturbances of skin sensation: Secondary | ICD-10-CM | POA: Diagnosis not present

## 2023-11-27 ENCOUNTER — Other Ambulatory Visit: Payer: Self-pay | Admitting: Internal Medicine

## 2023-11-27 DIAGNOSIS — Z1231 Encounter for screening mammogram for malignant neoplasm of breast: Secondary | ICD-10-CM

## 2023-12-10 ENCOUNTER — Ambulatory Visit
Admission: RE | Admit: 2023-12-10 | Discharge: 2023-12-10 | Disposition: A | Discharge status: Home / Self Care | Source: Ambulatory Visit | Attending: Internal Medicine | Admitting: Internal Medicine

## 2023-12-10 DIAGNOSIS — Z1231 Encounter for screening mammogram for malignant neoplasm of breast: Secondary | ICD-10-CM | POA: Insufficient documentation

## 2023-12-11 ENCOUNTER — Encounter: Payer: Self-pay | Admitting: Obstetrics and Gynecology

## 2023-12-16 ENCOUNTER — Encounter: Payer: Self-pay | Admitting: Obstetrics and Gynecology

## 2023-12-19 DIAGNOSIS — C4442 Squamous cell carcinoma of skin of scalp and neck: Secondary | ICD-10-CM | POA: Diagnosis not present

## 2023-12-19 DIAGNOSIS — D485 Neoplasm of uncertain behavior of skin: Secondary | ICD-10-CM | POA: Diagnosis not present

## 2023-12-19 DIAGNOSIS — L538 Other specified erythematous conditions: Secondary | ICD-10-CM | POA: Diagnosis not present

## 2023-12-19 DIAGNOSIS — R208 Other disturbances of skin sensation: Secondary | ICD-10-CM | POA: Diagnosis not present

## 2023-12-24 ENCOUNTER — Ambulatory Visit (INDEPENDENT_AMBULATORY_CARE_PROVIDER_SITE_OTHER): Payer: 59 | Admitting: Internal Medicine

## 2023-12-24 ENCOUNTER — Encounter: Payer: 59 | Admitting: Internal Medicine

## 2023-12-24 ENCOUNTER — Encounter: Payer: Self-pay | Admitting: Internal Medicine

## 2023-12-24 VITALS — BP 128/84 | Ht 70.0 in | Wt 161.4 lb

## 2023-12-24 DIAGNOSIS — R739 Hyperglycemia, unspecified: Secondary | ICD-10-CM | POA: Diagnosis not present

## 2023-12-24 DIAGNOSIS — E7800 Pure hypercholesterolemia, unspecified: Secondary | ICD-10-CM

## 2023-12-24 DIAGNOSIS — Z0001 Encounter for general adult medical examination with abnormal findings: Secondary | ICD-10-CM | POA: Diagnosis not present

## 2023-12-24 MED ORDER — ALPRAZOLAM 0.5 MG PO TABS: 0.5000 mg | ORAL_TABLET | Freq: Every day | ORAL | 0 refills | Status: DC | PRN

## 2023-12-24 MED ORDER — BUPROPION HCL ER (SR) 100 MG PO TB12: 100.0000 mg | ORAL_TABLET | Freq: Every day | ORAL | 1 refills | Status: DC

## 2023-12-24 NOTE — Patient Instructions (Signed)

## 2023-12-24 NOTE — Progress Notes (Signed)
 Subjective:    Patient ID: Julie Jimenez, female    DOB: 1980/10/22, 43 y.o.   MRN: [id number]  HPI  Patient presents to clinic today for annual exam.    Flu: 05/2021 Tetanus: 06/2016 COVID: Pfizer x3 Pap smear: 09/2021 Mammogram: 11/2023 Vision screening: as needed Dentist: biannually  Diet: She does eat meat. She consumes fruits and veggies. She tries to avoid fried foods. She drinks mostly water, coffee and alcohol. Exercise: Spin, yoga, pilates, boot camp  Review of Systems     Past Medical History:  Diagnosis Date   Anxiety    Attention deficit disorder (ADD)    Breast cyst, right    Hemorrhoids    History of shingles 2013   LGSIL on Pap smear of cervix 02/17/2003    Current Outpatient Medications  Medication Sig Dispense Refill   ALPRAZolam (XANAX) 0.5 MG tablet Take 1 tablet (0.5 mg total) by mouth daily as needed for anxiety. 15 tablet 0   Ascorbic Acid (VITAMIN C) 1000 MG tablet Take 1,000 mg by mouth daily.     buPROPion (WELLBUTRIN) 75 MG tablet TAKE ONE TABLET BY MOUTH TWICE DAILY 180 tablet 0   Cholecalciferol (VITAMIN D-3) 25 MCG (1000 UT) CAPS Take by mouth.     ELDERBERRY PO Take by mouth.     fluticasone (FLONASE) 50 MCG/ACT nasal spray Place 2 sprays into both nostrils daily. 16 g 6   magnesium 30 MG tablet Take 30 mg by mouth 2 (two) times daily.     Menaquinone-7 (VITAMIN K2 PO) Take by mouth.     Omega-3 1000 MG CAPS Take by mouth.     valACYclovir (VALTREX) 500 MG tablet Take 1 tablet (500 mg total) by mouth 2 (two) times daily as needed. 180 tablet 0   vitamin B-12 (CYANOCOBALAMIN) 1000 MCG tablet Take 1,000 mcg by mouth daily.     Zinc 30 MG TABS Take by mouth.     No current facility-administered medications for this visit.    Allergies  Allergen Reactions   Augmentin [Amoxicillin-Pot Clavulanate] Rash   Codeine Nausea And Vomiting and Rash    Family History  Problem Relation Age of Onset   Parkinson's disease Father    Heart disease  Paternal Grandmother    Heart disease Paternal Grandfather    Breast cancer Neg Hx    Colon cancer Neg Hx     Social History   Socioeconomic History   Marital status: Single    Spouse name: Not on file   Number of children: Not on file   Years of education: Not on file   Highest education level: Not on file  Occupational History   Not on file  Tobacco Use   Smoking status: Some Days    Current packs/day: 0.25    Average packs/day: 0.3 packs/day for 10.0 years (2.5 ttl pk-yrs)    Types: Cigarettes   Smokeless tobacco: Never  Vaping Use   Vaping status: Never Used  Substance and Sexual Activity   Alcohol use: Yes    Alcohol/week: 8.0 standard drinks of alcohol    Types: 8 Glasses of wine per week    Comment: moderate   Drug use: No   Sexual activity: Yes    Birth control/protection: None  Other Topics Concern   Not on file  Social History Narrative   Not on file   Social Drivers of Health   Financial Resource Strain: Patient Declined (12/23/2023)   Overall Financial Resource Strain (CARDIA)  Difficulty of Paying Living Expenses: Patient declined  Food Insecurity: Patient Declined (12/23/2023)   Hunger Vital Sign    Worried About Running Out of Food in the Last Year: Patient declined    Ran Out of Food in the Last Year: Patient declined  Transportation Needs: Patient Declined (12/23/2023)   PRAPARE - Administrator, Civil Service (Medical): Patient declined    Lack of Transportation (Non-Medical): Patient declined  Physical Activity: Sufficiently Active (12/23/2023)   Exercise Vital Sign    Days of Exercise per Week: 5 days    Minutes of Exercise per Session: 80 min  Stress: Stress Concern Present (12/23/2023)   [employer] of Occupational Health - Occupational Stress Questionnaire    Feeling of Stress : Rather much  Social Connections: Socially Integrated (12/23/2023)   Social Connection and Isolation Panel [NHANES]    Frequency of Communication  with Friends and Family: More than three times a week    Frequency of Social Gatherings with Friends and Family: Once a week    Attends Religious Services: More than 4 times per year    Active Member of [employer] or Organizations: Yes    Attends Engineer, structural: More than 4 times per year    Marital Status: Living with partner  Intimate Partner Violence: Not At Risk (10/02/2017)   Humiliation, Afraid, Rape, and Kick questionnaire    Fear of Current or Ex-Partner: No    Emotionally Abused: No    Physically Abused: No    Sexually Abused: No     Constitutional: Denies fever, malaise, fatigue, headache or abrupt weight changes.  HEENT: Pt reports ulcer of tongue. Denies eye pain, eye redness, ear pain, ringing in the ears, wax buildup, runny nose, nasal congestion, bloody nose, or sore throat. Respiratory: Denies difficulty breathing, shortness of breath, cough or sputum production.   Cardiovascular: Denies chest pain, chest tightness, palpitations or swelling in the hands or feet.  Gastrointestinal:  Denies abdominal pain, bloating, constipation, diarrhea or blood in the stool.  GU: Denies urgency, frequency, pain with urination, burning sensation, blood in urine, odor or discharge. Musculoskeletal: Denies decrease in range of motion, difficulty with gait, muscle pain or joint pain and swelling.  Skin: Pt reports skin lesion of scalp. Denies redness, rashes, or ulcercations.  Neurological: Patient reports inattention, insomnia.  Denies dizziness, difficulty with memory, difficulty with speech or problems with balance and coordination.  Psych: Patient has a history of anxiety.  Denies depression, SI/HI.  No other specific complaints in a complete review of systems (except as listed in HPI above).  Objective:   Physical Exam  BP 128/84 (BP Location: Left Arm, Patient Position: Sitting, Cuff Size: Normal)   Ht 5\' 10"  (1.778 m)   Wt 161 lb 6.4 oz (73.2 kg)   LMP 11/27/2023  (Approximate)   BMI 23.16 kg/m   Wt Readings from Last 3 Encounters:  08/13/23 171 lb (77.6 kg)  12/18/22 171 lb (77.6 kg)  11/29/22 172 lb (78 kg)    General: Appears her stated age, well developed, well nourished in NAD. Skin: Warm, dry and intact.  Scabbed lesion of right side of scalp recently biopsied by dermatology. HEENT: Head: normal shape and size; Eyes: sclera white, no icterus, conjunctiva pink, PERRLA and EOMs intact;  Neck:  Neck supple, trachea midline. No masses, lumps or thyromegaly present.  Cardiovascular: Normal rate and rhythm. S1,S2 noted.  No murmur, rubs or gallops noted. No JVD or BLE edema.  Pulmonary/Chest: Normal  effort and positive vesicular breath sounds. No respiratory distress. No wheezes, rales or ronchi noted.  Abdomen: Soft and nontender. Normal bowel sounds. No distention or masses noted. Liver, spleen and kidneys non palpable. Musculoskeletal:Strength 5/5 BUE/BLE. No difficulty with gait.  Neurological: Alert and oriented. Cranial nerves II-XII grossly intact. Coordination normal.  Psychiatric: Mood and affect normal. Mildly anxious appearing. Judgment and thought content normal.     BMET    Component Value Date/Time   NA 137 12/18/2022 0838   NA 141 05/27/2014 0122   K 4.6 12/18/2022 0838   K 3.5 05/27/2014 0122   CL 105 12/18/2022 0838   CL 107 05/27/2014 0122   CO2 25 12/18/2022 0838   CO2 24 05/27/2014 0122   GLUCOSE 83 12/18/2022 0838   GLUCOSE 96 05/27/2014 0122   BUN 14 12/18/2022 0838   BUN 9 05/27/2014 0122   CREATININE 0.76 12/18/2022 0838   CALCIUM 9.1 12/18/2022 0838   CALCIUM 8.9 05/27/2014 0122   GFRNONAA >60 05/27/2014 0122   GFRAA >60 05/27/2014 0122    Lipid Panel     Component Value Date/Time   CHOL 178 12/18/2022 0838   TRIG 42 12/18/2022 0838   HDL 64 12/18/2022 0838   CHOLHDL 2.8 12/18/2022 0838   VLDL 9.4 12/12/2020 1047   LDLCALC 102 (H) 12/18/2022 0838    CBC    Component Value Date/Time   WBC 6.7  12/18/2022 0838   RBC 4.71 12/18/2022 0838   HGB 14.5 12/18/2022 0838   HGB 16.5 (H) 05/27/2014 0122   HCT 44.1 12/18/2022 0838   HCT 51.0 (H) 05/27/2014 0122   PLT 277 12/18/2022 0838   PLT 352 05/27/2014 0122   MCV 93.6 12/18/2022 0838   MCV 96 05/27/2014 0122   MCH 30.8 12/18/2022 0838   MCHC 32.9 12/18/2022 0838   RDW 12.5 12/18/2022 0838   RDW 12.6 05/27/2014 0122   LYMPHSABS 3.8 (H) 05/27/2014 0122   MONOABS 0.7 05/27/2014 0122   EOSABS 0.3 05/27/2014 0122   BASOSABS 0.1 05/27/2014 0122    Hgb A1C Lab Results  Component Value Date   HGBA1C 4.9 12/18/2022            Assessment & Plan:   Preventative Health Maintenance:  Encouraged her to get a flu shot in the fall Tetanus UTD Encouraged her to get a COVID booster Pap smear UTD Mammogram UTD Encouraged her to consume a balanced diet and exercise regimen Advised her to see an eye doctor and dentist annually We will check CBC, c-Met, lipid profile and A1c today    RTC in 6 months, follow-up chronic conditions Nicki Reaper, NP

## 2023-12-25 ENCOUNTER — Encounter: Payer: Self-pay | Admitting: Internal Medicine

## 2023-12-25 LAB — LIPID PANEL
Cholesterol: 198 mg/dL (ref <200)
HDL: 67 mg/dL (ref >=50)
LDL Cholesterol (Calc): 117 mg/dL — ABNORMAL HIGH
Non-HDL Cholesterol (Calc): 131 mg/dL — ABNORMAL HIGH (ref <130)
Total CHOL/HDL Ratio: 3 (calc) (ref <5.0)
Triglycerides: 46 mg/dL (ref <150)

## 2023-12-25 LAB — CBC
HCT: 46.3 % — ABNORMAL HIGH (ref 35.0–45.0)
Hemoglobin: 15.4 g/dL (ref 11.7–15.5)
MCH: 31.5 pg (ref 27.0–33.0)
MCHC: 33.3 g/dL (ref 32.0–36.0)
MCV: 94.7 fL (ref 80.0–100.0)
MPV: 9.8 fL (ref 7.5–12.5)
Platelets: 305 10*3/uL (ref 140–400)
RBC: 4.89 10*6/uL (ref 3.80–5.10)
RDW: 11.7 % (ref 11.0–15.0)
WBC: 7.9 10*3/uL (ref 3.8–10.8)

## 2023-12-25 LAB — COMPREHENSIVE METABOLIC PANEL WITH GFR
AG Ratio: 3.1 (calc) — ABNORMAL HIGH (ref 1.0–2.5)
ALT: 14 U/L (ref 6–29)
AST: 20 U/L (ref 10–30)
Albumin: 4.6 g/dL (ref 3.6–5.1)
Alkaline phosphatase (APISO): 38 U/L (ref 31–125)
BUN: 9 mg/dL (ref 7–25)
CO2: 26 mmol/L (ref 20–32)
Calcium: 9.4 mg/dL (ref 8.6–10.2)
Chloride: 106 mmol/L (ref 98–110)
Creat: 0.71 mg/dL (ref 0.50–0.99)
Globulin: 1.5 g/dL — ABNORMAL LOW (ref 1.9–3.7)
Glucose, Bld: 93 mg/dL (ref 65–99)
Potassium: 4.7 mmol/L (ref 3.5–5.3)
Sodium: 138 mmol/L (ref 135–146)
Total Bilirubin: 1.3 mg/dL — ABNORMAL HIGH (ref 0.2–1.2)
Total Protein: 6.1 g/dL (ref 6.1–8.1)
eGFR: 109 mL/min/{1.73_m2} (ref >=60)

## 2023-12-25 LAB — HEMOGLOBIN A1C
Hgb A1c MFr Bld: 5 %{Hb} (ref <5.7)
Mean Plasma Glucose: 97 mg/dL
eAG (mmol/L): 5.4 mmol/L

## 2024-01-28 ENCOUNTER — Telehealth: Admitting: Internal Medicine

## 2024-01-28 ENCOUNTER — Encounter: Payer: Self-pay | Admitting: Internal Medicine

## 2024-01-28 DIAGNOSIS — R300 Dysuria: Secondary | ICD-10-CM

## 2024-01-28 DIAGNOSIS — R3915 Urgency of urination: Secondary | ICD-10-CM

## 2024-01-28 DIAGNOSIS — N390 Urinary tract infection, site not specified: Secondary | ICD-10-CM | POA: Diagnosis not present

## 2024-01-28 DIAGNOSIS — R350 Frequency of micturition: Secondary | ICD-10-CM

## 2024-01-28 MED ORDER — NITROFURANTOIN MONOHYD MACRO 100 MG PO CAPS: 100.0000 mg | ORAL_CAPSULE | Freq: Two times a day (BID) | ORAL | 0 refills | Status: DC

## 2024-01-28 NOTE — Progress Notes (Signed)
 Virtual Visit via Video Note  I connected with Julie Jimenez on 01/28/24 at 11:40 AM EDT by a video enabled telemedicine application and verified that I am speaking with the correct person using two identifiers.  Location: Patient: Home Provider: Office  Person's participating in this video call: Helayne Lo, NP-C and Kessa Ledwith   I discussed the limitations of evaluation and management by telemedicine and the availability of in person appointments. The patient expressed understanding and agreed to proceed.  History of Present Illness:  Discussed the use of AI scribe software for clinical note transcription with the patient, who gave verbal consent to proceed.  Julie Jimenez "MACKENZIE" is a 43 year old female who presents with symptoms of a urinary tract infection.  Symptoms began two days ago and include urinary urgency, frequency, dysuria, and pain during urination. She also experiences a burning sensation in the clitoral area and a constant urge to urinate.  She has been using azo for symptom relief, which provides temporary relief for about three hours.  No fever, chills, nausea, vomiting, flank pain or hematuria.       Past Medical History:  Diagnosis Date   Anxiety    Attention deficit disorder (ADD)    Breast cyst, right    Hemorrhoids    History of shingles 2013   LGSIL on Pap smear of cervix 02/17/2003    Current Outpatient Medications  Medication Sig Dispense Refill   ALPRAZolam  (XANAX ) 0.5 MG tablet Take 1 tablet (0.5 mg total) by mouth daily as needed for anxiety. 15 tablet 0   Ascorbic Acid (VITAMIN C) 1000 MG tablet Take 1,000 mg by mouth daily.     buPROPion  ER (WELLBUTRIN  SR) 100 MG 12 hr tablet Take 1 tablet (100 mg total) by mouth daily. 90 tablet 1   Cholecalciferol (VITAMIN D-3) 25 MCG (1000 UT) CAPS Take by mouth.     ELDERBERRY PO Take by mouth.     magnesium 30 MG tablet Take 30 mg by mouth 2 (two) times daily.     Menaquinone-7 (VITAMIN K2 PO)  Take by mouth.     Omega-3 1000 MG CAPS Take by mouth.     tretinoin (RETIN-A) 0.1 % cream SMARTSIG:sparingly Topical Every Night     valACYclovir  (VALTREX ) 500 MG tablet Take 1 tablet (500 mg total) by mouth 2 (two) times daily as needed. 180 tablet 0   vitamin B-12 (CYANOCOBALAMIN) 1000 MCG tablet Take 1,000 mcg by mouth daily.     Zinc 30 MG TABS Take by mouth.     No current facility-administered medications for this visit.    Allergies  Allergen Reactions   Augmentin [Amoxicillin-Pot Clavulanate] Rash   Codeine Nausea And Vomiting and Rash    Family History  Problem Relation Age of Onset   Parkinson's disease Father    Heart disease Paternal Grandmother    Heart disease Paternal Grandfather    Breast cancer Neg Hx    Colon cancer Neg Hx     Social History   Socioeconomic History   Marital status: Single    Spouse name: Not on file   Number of children: Not on file   Years of education: Not on file   Highest education level: Not on file  Occupational History   Not on file  Tobacco Use   Smoking status: Former    Current packs/day: 0.25    Average packs/day: 0.3 packs/day for 10.0 years (2.5 ttl pk-yrs)    Types: Cigarettes  Smokeless tobacco: Never  Vaping Use   Vaping status: Never Used  Substance and Sexual Activity   Alcohol use: Yes    Alcohol/week: 8.0 standard drinks of alcohol    Types: 8 Glasses of wine per week    Comment: 1-2 drinks per day   Drug use: No   Sexual activity: Yes    Birth control/protection: None  Other Topics Concern   Not on file  Social History Narrative   Not on file   Social Drivers of Health   Financial Resource Strain: Patient Declined (12/23/2023)   Overall Financial Resource Strain (CARDIA)    Difficulty of Paying Living Expenses: Patient declined  Food Insecurity: Patient Declined (12/23/2023)   Hunger Vital Sign    Worried About Running Out of Food in the Last Year: Patient declined    Ran Out of Food in the Last  Year: Patient declined  Transportation Needs: Patient Declined (12/23/2023)   PRAPARE - Administrator, Civil Service (Medical): Patient declined    Lack of Transportation (Non-Medical): Patient declined  Physical Activity: Sufficiently Active (12/23/2023)   Exercise Vital Sign    Days of Exercise per Week: 5 days    Minutes of Exercise per Session: 80 min  Stress: Stress Concern Present (12/23/2023)   [employer] of Occupational Health - Occupational Stress Questionnaire    Feeling of Stress : Rather much  Social Connections: Socially Integrated (12/23/2023)   Social Connection and Isolation Panel [NHANES]    Frequency of Communication with Friends and Family: More than three times a week    Frequency of Social Gatherings with Friends and Family: Once a week    Attends Religious Services: More than 4 times per year    Active Member of [employer] or Organizations: Yes    Attends Banker Meetings: More than 4 times per year    Marital Status: Living with partner  Intimate Partner Violence: Not At Risk (10/02/2017)   Humiliation, Afraid, Rape, and Kick questionnaire    Fear of Current or Ex-Partner: No    Emotionally Abused: No    Physically Abused: No    Sexually Abused: No     Constitutional: Denies fever, malaise, fatigue, headache or abrupt weight changes.  Respiratory: Denies difficulty breathing, shortness of breath, cough or sputum production.   Cardiovascular: Denies chest pain, chest tightness, palpitations or swelling in the hands or feet.  Gastrointestinal: Denies abdominal pain, bloating, constipation, diarrhea or blood in the stool.  GU: Patient reports urinary urgency, frequency, pain with urination and burning sensation.  Denies blood in urine, odor or discharge.  No other specific complaints in a complete review of systems (except as listed in HPI above).  Observations/Objective:   Wt Readings from Last 3 Encounters:  12/24/23 161 lb 6.4 oz  (73.2 kg)  08/13/23 171 lb (77.6 kg)  12/18/22 171 lb (77.6 kg)    General: Appears her stated age, well developed, well nourished in NAD. Pulmonary/Chest: Normal effort. Neurological: Alert and oriented.   BMET    Component Value Date/Time   NA 138 12/24/2023 0818   NA 141 05/27/2014 0122   K 4.7 12/24/2023 0818   K 3.5 05/27/2014 0122   CL 106 12/24/2023 0818   CL 107 05/27/2014 0122   CO2 26 12/24/2023 0818   CO2 24 05/27/2014 0122   GLUCOSE 93 12/24/2023 0818   GLUCOSE 96 05/27/2014 0122   BUN 9 12/24/2023 0818   BUN 9 05/27/2014 0122   CREATININE  0.71 12/24/2023 0818   CALCIUM 9.4 12/24/2023 0818   CALCIUM 8.9 05/27/2014 0122   GFRNONAA >60 05/27/2014 0122   GFRAA >60 05/27/2014 0122    Lipid Panel     Component Value Date/Time   CHOL 198 12/24/2023 0818   TRIG 46 12/24/2023 0818   HDL 67 12/24/2023 0818   CHOLHDL 3.0 12/24/2023 0818   VLDL 9.4 12/12/2020 1047   LDLCALC 117 (H) 12/24/2023 0818    CBC    Component Value Date/Time   WBC 7.9 12/24/2023 0818   RBC 4.89 12/24/2023 0818   HGB 15.4 12/24/2023 0818   HGB 16.5 (H) 05/27/2014 0122   HCT 46.3 (H) 12/24/2023 0818   HCT 51.0 (H) 05/27/2014 0122   PLT 305 12/24/2023 0818   PLT 352 05/27/2014 0122   MCV 94.7 12/24/2023 0818   MCV 96 05/27/2014 0122   MCH 31.5 12/24/2023 0818   MCHC 33.3 12/24/2023 0818   RDW 11.7 12/24/2023 0818   RDW 12.6 05/27/2014 0122   LYMPHSABS 3.8 (H) 05/27/2014 0122   MONOABS 0.7 05/27/2014 0122   EOSABS 0.3 05/27/2014 0122   BASOSABS 0.1 05/27/2014 0122    Hgb A1C Lab Results  Component Value Date   HGBA1C 5.0 12/24/2023       Assessment and Plan:  Assessment and Plan    Urinary tract infection Acute UTI with urgency, frequency, dysuria, and clitoral burning. No systemic symptoms. -Unable to obtain urine sample at this time as this is a video visit - Prescribed Macrobid  100 mg BID for 5 days. - Advised increased fluid intake. -Okay to continue AZO OTC  as needed - Instructed to submit urine sample if symptoms persist by week's end.      RTC in 5 months for follow-up of chronic conditions  Follow Up Instructions:    I discussed the assessment and treatment plan with the patient. The patient was provided an opportunity to ask questions and all were answered. The patient agreed with the plan and demonstrated an understanding of the instructions.   The patient was advised to call back or seek an in-person evaluation if the symptoms worsen or if the condition fails to improve as anticipated.   Helayne Lo, NP

## 2024-01-28 NOTE — Patient Instructions (Signed)

## 2024-01-31 ENCOUNTER — Other Ambulatory Visit: Payer: Self-pay | Admitting: Internal Medicine

## 2024-01-31 MED ORDER — CIPROFLOXACIN HCL 500 MG PO TABS: 500.0000 mg | ORAL_TABLET | Freq: Two times a day (BID) | ORAL | 0 refills | Status: AC

## 2024-01-31 NOTE — Progress Notes (Signed)
cipro

## 2024-02-06 DIAGNOSIS — C4442 Squamous cell carcinoma of skin of scalp and neck: Secondary | ICD-10-CM | POA: Diagnosis not present

## 2024-02-24 DIAGNOSIS — Z859 Personal history of malignant neoplasm, unspecified: Secondary | ICD-10-CM | POA: Diagnosis not present

## 2024-02-28 ENCOUNTER — Telehealth: Admitting: Internal Medicine

## 2024-02-28 ENCOUNTER — Encounter: Payer: Self-pay | Admitting: Internal Medicine

## 2024-02-28 DIAGNOSIS — J329 Chronic sinusitis, unspecified: Secondary | ICD-10-CM

## 2024-02-28 DIAGNOSIS — B9789 Other viral agents as the cause of diseases classified elsewhere: Secondary | ICD-10-CM | POA: Diagnosis not present

## 2024-02-28 MED ORDER — PREDNISONE 10 MG PO TABS: ORAL_TABLET | ORAL | 0 refills | Status: DC

## 2024-02-28 MED ORDER — DOXYCYCLINE HYCLATE 100 MG PO TABS: 100.0000 mg | ORAL_TABLET | Freq: Two times a day (BID) | ORAL | 0 refills | Status: DC

## 2024-02-28 NOTE — Patient Instructions (Signed)
 Sinus Pain  Sinus pain may occur when your sinuses become clogged or swollen. Sinuses are air-filled spaces in your skull that are behind the bones of your face and forehead. Sinus pain can range from mild to severe. What are the causes? Sinus pain can result from various conditions that affect the sinuses. Common causes include: Colds. Sinus infections. Allergies. What are the signs or symptoms? The main symptom of this condition is pain or pressure in your face, forehead, ears, or upper teeth. People who have sinus pain often have other symptoms, such as: Congested or runny nose. Fever. Inability to smell. Headache. Weather changes can make symptoms worse. How is this diagnosed? Your health care provider will diagnose this condition based on your symptoms and a physical exam. If you have pain that keeps coming back or does not go away, your health care provider may recommend more testing. This may include: Imaging tests, such as a CT scan or MRI, to check for problems with your sinuses. Examination of your sinuses using a thin tool with a camera that is inserted through your nose (endoscopy). How is this treated? Treatment for this condition depends on the cause. Sinus pain that is caused by a sinus infection may be treated with antibiotic medicine. Sinus pain that is caused by congestion may be helped by rinsing out (flushing) the nose and sinuses with saline solution. Sinus pain that is caused by allergies may be helped by allergy medicines (antihistamines) and medicated nasal sprays. Sinus surgery may be needed in some cases if other treatments do not help. Follow these instructions at home: General instructions If directed: Apply a warm, moist washcloth to your face to help relieve pain. Use a nasal saline wash. Follow the directions on the bottle or box. Hydrate and humidify Drink enough water to keep your urine clear or pale yellow. Staying hydrated will help to thin your  mucus. Use a humidifier if your home is dry. Inhale steam for 10-15 minutes, 3-4 times a day or as told by your health care provider. You can do this in the bathroom while a hot shower is running. Limit your exposure to cool or dry air. Medicines  Take over-the-counter and prescription medicines only as told by your health care provider. If you were prescribed an antibiotic medicine, take it as told by your health care provider. Do not stop taking the antibiotic even if you start to feel better. If you have congestion, use a nasal spray to help lessen pressure. Contact a health care provider if: You have sinus pain more than one time a week. You have sensitivity to light or sound. You develop a fever. You feel nauseous or you vomit. Your sinus pain or headache does not get better with treatment. Get help right away if: You have vision problems. You have sudden, severe pain in your face or head. You have a seizure. You are confused. You have a stiff neck. Summary Sinus pain occurs when your sinuses become clogged or swollen. Sinus pain can result from various conditions that affect the sinuses, such as a cold, a sinus infection, or an allergy. Treatment for this condition depends on the cause. It may include medicine, such as antibiotics or antihistamines. This information is not intended to replace advice given to you by your health care provider. Make sure you discuss any questions you have with your health care provider. Document Revised: 08/13/2021 Document Reviewed: 08/13/2021 Elsevier Patient Education  2024 [employer].

## 2024-02-28 NOTE — Progress Notes (Signed)
 Virtual Visit via Video Note  I connected with Julie Jimenez on 02/28/24 at  1:00 PM EDT by a video enabled telemedicine application and verified that I am speaking with the correct person using two identifiers.  Location: Patient: Home Provider: Office  Person's participating in this video call: Helayne Lo, NP-C and   I discussed the limitations of evaluation and management by telemedicine and the availability of in person appointments. The patient expressed understanding and agreed to proceed.  History of Present Illness:  Discussed the use of AI scribe software for clinical note transcription with the patient, who gave verbal consent to proceed.  Julie Jimenez "MACKENZIE" is a 43 year old female who presents with a persistent headache for one week.  She has been experiencing a headache for the past week, primarily located behind her eyes and on the top of her head, described as pressure. The headache is persistent and has not been relieved by over-the-counter medications such as sudafed, ibuprofen, or tylenol.  She has clear nasal discharge, except in the mornings when it is mixed with a little blood. She also has mild congestion and sneezing over the past few days. Initially, she had a slight sore throat which has resolved. No significant body aches are present, but she feels nauseous at times, attributing it to lack of appetite and drainage.  She has taken three COVID tests, including two that tested for flu A and B, all of which were negative.    Past Medical History:  Diagnosis Date   Anxiety    Attention deficit disorder (ADD)    Breast cyst, right    Hemorrhoids    History of shingles 2013   LGSIL on Pap smear of cervix 02/17/2003    Current Outpatient Medications  Medication Sig Dispense Refill   ALPRAZolam  (XANAX ) 0.5 MG tablet Take 1 tablet (0.5 mg total) by mouth daily as needed for anxiety. 15 tablet 0   Ascorbic Acid (VITAMIN C) 1000 MG tablet Take 1,000 mg by  mouth daily.     buPROPion  ER (WELLBUTRIN  SR) 100 MG 12 hr tablet Take 1 tablet (100 mg total) by mouth daily. 90 tablet 1   Cholecalciferol (VITAMIN D-3) 25 MCG (1000 UT) CAPS Take by mouth.     ELDERBERRY PO Take by mouth.     magnesium 30 MG tablet Take 30 mg by mouth 2 (two) times daily.     Menaquinone-7 (VITAMIN K2 PO) Take by mouth.     Omega-3 1000 MG CAPS Take by mouth.     tretinoin (RETIN-A) 0.1 % cream SMARTSIG:sparingly Topical Every Night     valACYclovir  (VALTREX ) 500 MG tablet Take 1 tablet (500 mg total) by mouth 2 (two) times daily as needed. 180 tablet 0   vitamin B-12 (CYANOCOBALAMIN) 1000 MCG tablet Take 1,000 mcg by mouth daily.     Zinc 30 MG TABS Take by mouth.     No current facility-administered medications for this visit.    Allergies  Allergen Reactions   Augmentin [Amoxicillin-Pot Clavulanate] Rash   Codeine Nausea And Vomiting and Rash    Family History  Problem Relation Age of Onset   Parkinson's disease Father    Heart disease Paternal Grandmother    Heart disease Paternal Grandfather    Breast cancer Neg Hx    Colon cancer Neg Hx     Social History   Socioeconomic History   Marital status: Single    Spouse name: Not on file   Number  of children: Not on file   Years of education: Not on file   Highest education level: Not on file  Occupational History   Not on file  Tobacco Use   Smoking status: Former    Current packs/day: 0.25    Average packs/day: 0.3 packs/day for 10.0 years (2.5 ttl pk-yrs)    Types: Cigarettes   Smokeless tobacco: Never  Vaping Use   Vaping status: Never Used  Substance and Sexual Activity   Alcohol use: Yes    Alcohol/week: 8.0 standard drinks of alcohol    Types: 8 Glasses of wine per week    Comment: 1-2 drinks per day   Drug use: No   Sexual activity: Yes    Birth control/protection: None  Other Topics Concern   Not on file  Social History Narrative   Not on file   Social Drivers of Health    Financial Resource Strain: Patient Declined (12/23/2023)   Overall Financial Resource Strain (CARDIA)    Difficulty of Paying Living Expenses: Patient declined  Food Insecurity: Patient Declined (12/23/2023)   Hunger Vital Sign    Worried About Running Out of Food in the Last Year: Patient declined    Ran Out of Food in the Last Year: Patient declined  Transportation Needs: Patient Declined (12/23/2023)   PRAPARE - Administrator, Civil Service (Medical): Patient declined    Lack of Transportation (Non-Medical): Patient declined  Physical Activity: Sufficiently Active (12/23/2023)   Exercise Vital Sign    Days of Exercise per Week: 5 days    Minutes of Exercise per Session: 80 min  Stress: Stress Concern Present (12/23/2023)   [employer] of Occupational Health - Occupational Stress Questionnaire    Feeling of Stress : Rather much  Social Connections: Socially Integrated (12/23/2023)   Social Connection and Isolation Panel [NHANES]    Frequency of Communication with Friends and Family: More than three times a week    Frequency of Social Gatherings with Friends and Family: Once a week    Attends Religious Services: More than 4 times per year    Active Member of [employer] or Organizations: Yes    Attends Banker Meetings: More than 4 times per year    Marital Status: Living with partner  Intimate Partner Violence: Not At Risk (10/02/2017)   Humiliation, Afraid, Rape, and Kick questionnaire    Fear of Current or Ex-Partner: No    Emotionally Abused: No    Physically Abused: No    Sexually Abused: No     Constitutional: Pt reports headache. Denies fever, malaise, fatigue, or abrupt weight changes.  HEENT: Pt reports sinus pressure, runny nose and sore throat. Denies eye pain, eye redness, ear pain, ringing in the ears, wax buildup, nasal congestion, bloody nose. Respiratory: Pt reports cough. Denies difficulty breathing, shortness of breath, cough or sputum  production.   Cardiovascular: Denies chest pain, chest tightness, palpitations or swelling in the hands or feet.  Musculoskeletal: Denies decrease in range of motion, difficulty with gait, muscle pain or joint pain and swelling.  Neurological: Denies dizziness, difficulty with memory, difficulty with speech or problems with balance and coordination.    No other specific complaints in a complete review of systems (except as listed in HPI above).  Observations/Objective:   Wt Readings from Last 3 Encounters:  12/24/23 161 lb 6.4 oz (73.2 kg)  08/13/23 171 lb (77.6 kg)  12/18/22 171 lb (77.6 kg)    General: Appears her stated age,  in NAD. HEENT: Head: normal shape and size, maxillary sinus pressure reported; Eyes: sclera white, no icterus, conjunctiva pink, PERRLA and EOMs intact; Throat/Mouth: No hoarseness noted.  Pulmonary/Chest: Normal effort. No respiratory distress.  Neurological: Alert and oriented.   BMET    Component Value Date/Time   NA 138 12/24/2023 0818   NA 141 05/27/2014 0122   K 4.7 12/24/2023 0818   K 3.5 05/27/2014 0122   CL 106 12/24/2023 0818   CL 107 05/27/2014 0122   CO2 26 12/24/2023 0818   CO2 24 05/27/2014 0122   GLUCOSE 93 12/24/2023 0818   GLUCOSE 96 05/27/2014 0122   BUN 9 12/24/2023 0818   BUN 9 05/27/2014 0122   CREATININE 0.71 12/24/2023 0818   CALCIUM 9.4 12/24/2023 0818   CALCIUM 8.9 05/27/2014 0122   GFRNONAA >60 05/27/2014 0122   GFRAA >60 05/27/2014 0122    Lipid Panel     Component Value Date/Time   CHOL 198 12/24/2023 0818   TRIG 46 12/24/2023 0818   HDL 67 12/24/2023 0818   CHOLHDL 3.0 12/24/2023 0818   VLDL 9.4 12/12/2020 1047   LDLCALC 117 (H) 12/24/2023 0818    CBC    Component Value Date/Time   WBC 7.9 12/24/2023 0818   RBC 4.89 12/24/2023 0818   HGB 15.4 12/24/2023 0818   HGB 16.5 (H) 05/27/2014 0122   HCT 46.3 (H) 12/24/2023 0818   HCT 51.0 (H) 05/27/2014 0122   PLT 305 12/24/2023 0818   PLT 352 05/27/2014 0122    MCV 94.7 12/24/2023 0818   MCV 96 05/27/2014 0122   MCH 31.5 12/24/2023 0818   MCHC 33.3 12/24/2023 0818   RDW 11.7 12/24/2023 0818   RDW 12.6 05/27/2014 0122   LYMPHSABS 3.8 (H) 05/27/2014 0122   MONOABS 0.7 05/27/2014 0122   EOSABS 0.3 05/27/2014 0122   BASOSABS 0.1 05/27/2014 0122    Hgb A1C Lab Results  Component Value Date   HGBA1C 5.0 12/24/2023       Assessment and Plan:  Assessment and Plan    Sinusitis Suspected sinusitis due to inflammation, possibly infection, exacerbated by environmental factors. - Prescribed 6-day prednisone  taper. - If no improvement after 2-3 days, prescribe doxycycline  100 mg BID for 10 days. - Advised acetaminophen for pain relief, up to 1000 mg every 6 hours.        Follow Up Instructions:    I discussed the assessment and treatment plan with the patient. The patient was provided an opportunity to ask questions and all were answered. The patient agreed with the plan and demonstrated an understanding of the instructions.   The patient was advised to call back or seek an in-person evaluation if the symptoms worsen or if the condition fails to improve as anticipated.   Helayne Lo, NP

## 2024-03-16 ENCOUNTER — Encounter: Payer: Self-pay | Admitting: Internal Medicine

## 2024-03-17 MED ORDER — DIAZEPAM 5 MG PO TABS: ORAL_TABLET | ORAL | 0 refills | Status: DC

## 2024-06-24 ENCOUNTER — Encounter: Payer: Self-pay | Admitting: Internal Medicine

## 2024-06-25 ENCOUNTER — Encounter: Payer: Self-pay | Admitting: Internal Medicine

## 2024-06-25 ENCOUNTER — Ambulatory Visit: Admitting: Internal Medicine

## 2024-06-25 VITALS — BP 124/74 | Ht 70.0 in | Wt 169.0 lb

## 2024-06-25 DIAGNOSIS — E7800 Pure hypercholesterolemia, unspecified: Secondary | ICD-10-CM | POA: Diagnosis not present

## 2024-06-25 DIAGNOSIS — D751 Secondary polycythemia: Secondary | ICD-10-CM | POA: Insufficient documentation

## 2024-06-25 DIAGNOSIS — F411 Generalized anxiety disorder: Secondary | ICD-10-CM

## 2024-06-25 DIAGNOSIS — F5104 Psychophysiologic insomnia: Secondary | ICD-10-CM

## 2024-06-25 DIAGNOSIS — F902 Attention-deficit hyperactivity disorder, combined type: Secondary | ICD-10-CM | POA: Diagnosis not present

## 2024-06-25 MED ORDER — BUPROPION HCL ER (SR) 200 MG PO TB12: 200.0000 mg | ORAL_TABLET | Freq: Two times a day (BID) | ORAL | 1 refills | Status: DC

## 2024-06-25 MED ORDER — ALPRAZOLAM 0.5 MG PO TABS: 0.5000 mg | ORAL_TABLET | Freq: Every day | ORAL | 0 refills | Status: AC | PRN

## 2024-06-25 NOTE — Assessment & Plan Note (Signed)
 Will increase bupropion  to 200 mg SR once daily-also for smoking cessation Continue alprazolam  0.5 mg daily as needed Support offered

## 2024-06-25 NOTE — Assessment & Plan Note (Signed)
 Encouraged low fat diet

## 2024-06-25 NOTE — Assessment & Plan Note (Signed)
 Encouraged smoking cessation Will check CBC at annual exam

## 2024-06-25 NOTE — Assessment & Plan Note (Signed)
 Not medicated We will monitor

## 2024-06-25 NOTE — Progress Notes (Signed)
 Subjective:    Patient ID: Julie Jimenez, female    DOB: [birth year], 43 y.o.   MRN: [id number]  HPI  Patient presents to clinic today for follow-up chronic conditions.  ADHD: Chronic, reporting mainly inattention, but not medicated.  She is not currently seeing a psychiatrist.  Anxiety: Generalized.  She takes bupropion  as prescribed and alprazolam  on as needed for relief of symptoms.  She is not currently seeing a therapist.  She denies depression, SI/HI.  Insomnia: She is able to fall asleep but she is unable to stay asleep. She is not currently taking anything OTC for this.  There is no sleep study on file.  HLD: Her last LDL was 117, triglycerides 46, 12/2023.  She is not taking any cholesterol-lowering medication at this time.  She tries to consume a low-fat diet.  Polycythemia: Her last H/H was 16.5/46.3, 12/2023.  She no longer smokes.  She does not follow with hematology.  Review of Systems     Past Medical History:  Diagnosis Date   Anxiety    Attention deficit disorder (ADD)    Breast cyst, right    Hemorrhoids    History of shingles 2013   LGSIL on Pap smear of cervix 02/17/2003    Current Outpatient Medications  Medication Sig Dispense Refill   ALPRAZolam  (XANAX ) 0.5 MG tablet Take 1 tablet (0.5 mg total) by mouth daily as needed for anxiety. 15 tablet 0   Ascorbic Acid (VITAMIN C) 1000 MG tablet Take 1,000 mg by mouth daily.     buPROPion  ER (WELLBUTRIN  SR) 100 MG 12 hr tablet Take 1 tablet (100 mg total) by mouth daily. 90 tablet 1   Cholecalciferol (VITAMIN D-3) 25 MCG (1000 UT) CAPS Take by mouth.     diazepam  (VALIUM ) 5 MG tablet Take 1 tablet p.o. 30 minutes prior to your appointment 1 tablet 0   doxycycline  (VIBRA -TABS) 100 MG tablet Take 1 tablet (100 mg total) by mouth 2 (two) times daily. 20 tablet 0   ELDERBERRY PO Take by mouth.     magnesium 30 MG tablet Take 30 mg by mouth 2 (two) times daily.     Menaquinone-7 (VITAMIN K2 PO) Take by mouth.      Omega-3 1000 MG CAPS Take by mouth.     predniSONE  (DELTASONE ) 10 MG tablet Take 6 tabs on day 1, 5 tabs on day 2, 4 tabs on day 3, 3 tabs on day 4, 2 tabs on day 5, 1 tab on day 6 21 tablet 0   tretinoin (RETIN-A) 0.1 % cream SMARTSIG:sparingly Topical Every Night     valACYclovir  (VALTREX ) 500 MG tablet Take 1 tablet (500 mg total) by mouth 2 (two) times daily as needed. 180 tablet 0   vitamin B-12 (CYANOCOBALAMIN) 1000 MCG tablet Take 1,000 mcg by mouth daily.     Zinc 30 MG TABS Take by mouth.     No current facility-administered medications for this visit.    Allergies  Allergen Reactions   Augmentin [Amoxicillin-Pot Clavulanate] Rash   Codeine Nausea And Vomiting and Rash    Family History  Problem Relation Age of Onset   Parkinson's disease Father    Heart disease Paternal Grandmother    Heart disease Paternal Grandfather    Breast cancer Neg Hx    Colon cancer Neg Hx     Social History   Socioeconomic History   Marital status: Single    Spouse name: Not on file   Number of children:  Not on file   Years of education: Not on file   Highest education level: Not on file  Occupational History   Not on file  Tobacco Use   Smoking status: Former    Current packs/day: 0.25    Average packs/day: 0.3 packs/day for 10.0 years (2.5 ttl pk-yrs)    Types: Cigarettes   Smokeless tobacco: Never  Vaping Use   Vaping status: Never Used  Substance and Sexual Activity   Alcohol use: Yes    Alcohol/week: 8.0 standard drinks of alcohol    Types: 8 Glasses of wine per week    Comment: 1-2 drinks per day   Drug use: No   Sexual activity: Yes    Birth control/protection: None  Other Topics Concern   Not on file  Social History Narrative   Not on file   Social Drivers of Health   Financial Resource Strain: Patient Declined (12/23/2023)   Overall Financial Resource Strain (CARDIA)    Difficulty of Paying Living Expenses: Patient declined  Food Insecurity: Patient Declined  (12/23/2023)   Hunger Vital Sign    Worried About Running Out of Food in the Last Year: Patient declined    Ran Out of Food in the Last Year: Patient declined  Transportation Needs: Patient Declined (12/23/2023)   PRAPARE - Administrator, Civil Service (Medical): Patient declined    Lack of Transportation (Non-Medical): Patient declined  Physical Activity: Sufficiently Active (12/23/2023)   Exercise Vital Sign    Days of Exercise per Week: 5 days    Minutes of Exercise per Session: 80 min  Stress: Stress Concern Present (12/23/2023)   [employer] of Occupational Health - Occupational Stress Questionnaire    Feeling of Stress : Rather much  Social Connections: Socially Integrated (12/23/2023)   Social Connection and Isolation Panel    Frequency of Communication with Friends and Family: More than three times a week    Frequency of Social Gatherings with Friends and Family: Once a week    Attends Religious Services: More than 4 times per year    Active Member of [employer] or Organizations: Yes    Attends Banker Meetings: More than 4 times per year    Marital Status: Living with partner  Intimate Partner Violence: Not At Risk (10/02/2017)   Humiliation, Afraid, Rape, and Kick questionnaire    Fear of Current or Ex-Partner: No    Emotionally Abused: No    Physically Abused: No    Sexually Abused: No     Constitutional: Denies fever, malaise, fatigue, headache or abrupt weight changes.  HEENT: Denies eye pain, eye redness, ear pain, ringing in the ears, wax buildup, runny nose, nasal congestion, bloody nose, or sore throat. Respiratory: Denies difficulty breathing, shortness of breath, cough or sputum production.   Cardiovascular: Denies chest pain, chest tightness, palpitations or swelling in the hands or feet.  Gastrointestinal: Pt reports left upper quadrant pain. Denies bloating, constipation, diarrhea or blood in the stool.  GU: Denies urgency, frequency, pain  with urination, burning sensation, blood in urine, odor or discharge. Musculoskeletal: Denies decrease in range of motion, difficulty with gait, muscle pain or joint pain and swelling.  Skin: Denies redness, rashes, lesions or ulcercations.  Neurological: Patient reports inattention, insomnia.  Denies dizziness, difficulty with memory, difficulty with speech or problems with balance and coordination.  Psych: Patient has a history of anxiety.  Denies depression, SI/HI.  No other specific complaints in a complete review of systems (except  as listed in HPI above).  Objective:   Physical Exam  BP 124/74 (BP Location: Left Arm, Patient Position: Sitting, Cuff Size: Normal)   Ht 5' 10 (1.778 m)   Wt 169 lb (76.7 kg)   LMP 06/15/2024 (Approximate)   BMI 24.25 kg/m   Wt Readings from Last 3 Encounters:  12/24/23 161 lb 6.4 oz (73.2 kg)  08/13/23 171 lb (77.6 kg)  12/18/22 171 lb (77.6 kg)    General: Appears her stated age, well developed, well nourished in NAD. Skin: Warm, dry and intact.  Cardiovascular: Normal rate and rhythm. Pulmonary/Chest: Normal effort and positive vesicular breath sounds. No respiratory distress. No wheezes, rales or ronchi noted.  Musculoskeletal: No difficulty with gait.  Neurological: Alert and oriented. Coordination normal.  Psychiatric: Mood and affect normal. Mildly anxious appearing. Judgment and thought content normal.     BMET    Component Value Date/Time   NA 138 12/24/2023 0818   NA 141 05/27/2014 0122   K 4.7 12/24/2023 0818   K 3.5 05/27/2014 0122   CL 106 12/24/2023 0818   CL 107 05/27/2014 0122   CO2 26 12/24/2023 0818   CO2 24 05/27/2014 0122   GLUCOSE 93 12/24/2023 0818   GLUCOSE 96 05/27/2014 0122   BUN 9 12/24/2023 0818   BUN 9 05/27/2014 0122   CREATININE 0.71 12/24/2023 0818   CALCIUM 9.4 12/24/2023 0818   CALCIUM 8.9 05/27/2014 0122   GFRNONAA >60 05/27/2014 0122   GFRAA >60 05/27/2014 0122    Lipid Panel     Component  Value Date/Time   CHOL 198 12/24/2023 0818   TRIG 46 12/24/2023 0818   HDL 67 12/24/2023 0818   CHOLHDL 3.0 12/24/2023 0818   VLDL 9.4 12/12/2020 1047   LDLCALC 117 (H) 12/24/2023 0818    CBC    Component Value Date/Time   WBC 7.9 12/24/2023 0818   RBC 4.89 12/24/2023 0818   HGB 15.4 12/24/2023 0818   HGB 16.5 (H) 05/27/2014 0122   HCT 46.3 (H) 12/24/2023 0818   HCT 51.0 (H) 05/27/2014 0122   PLT 305 12/24/2023 0818   PLT 352 05/27/2014 0122   MCV 94.7 12/24/2023 0818   MCV 96 05/27/2014 0122   MCH 31.5 12/24/2023 0818   MCHC 33.3 12/24/2023 0818   RDW 11.7 12/24/2023 0818   RDW 12.6 05/27/2014 0122   LYMPHSABS 3.8 (H) 05/27/2014 0122   MONOABS 0.7 05/27/2014 0122   EOSABS 0.3 05/27/2014 0122   BASOSABS 0.1 05/27/2014 0122    Hgb A1C Lab Results  Component Value Date   HGBA1C 5.0 12/24/2023            Assessment & Plan:     RTC in 6 months for your annual exam Angeline Laura, NP

## 2024-06-25 NOTE — Assessment & Plan Note (Signed)
 No issues off meds at this time We will monitor

## 2024-06-25 NOTE — Patient Instructions (Signed)
Bupropion Sustained-Release Tablets (Smoking Cessation) What is this medication? BUPROPION (byoo PROE pee on) helps you quit smoking. It reduces cravings for nicotine, the addictive substance found in tobacco. It may also help reduce symptoms of withdrawal. It is most effective when used in combination with a stop-smoking program. This medicine may be used for other purposes; ask your health care provider or pharmacist if you have questions. COMMON BRAND NAME(S): Buproban, Zyban What should I tell my care team before I take this medication? They need to know if you have any of these conditions: An eating disorder, such as anorexia or bulimia Bipolar disorder or psychosis Diabetes or high blood sugar, treated with medication Glaucoma Head injury or brain tumor Heart disease, previous heart attack, or irregular heart beat High blood pressure Kidney disease Liver disease Seizures Suicidal thoughts, plans, or attempt by you or a family member Tourette syndrome Weight loss An unusual or allergic reaction to bupropion, other medications, foods, dyes, or preservatives Pregnant or trying to become pregnant Breastfeeding How should I use this medication? Take this medication by mouth with a glass of water. Follow the directions on the prescription label. You can take it with or without food. If it upsets your stomach, take it with food. Do not cut, crush or chew this medication. Take your medication at regular intervals. If you take this medication more than once a day, take your second dose at least 8 hours after you take your first dose. To limit trouble in sleeping, avoid taking this medication at bedtime. Do not take your medication more often than directed. Do not stop taking this medication suddenly except upon the advice of your care team. Stopping this medication too quickly may cause serious side effects. A special MedGuide will be given to you by the pharmacist with each prescription and  refill. Be sure to read this information carefully each time. Talk to your care team about the use of this medication in children. Special care may be needed. Overdosage: If you think you have taken too much of this medicine contact a poison control center or emergency room at once. NOTE: This medicine is only for you. Do not share this medicine with others. What if I miss a dose? If you miss a dose, skip the missed dose and take your next tablet at the regular time. There should be at least 8 hours between doses. Do not take double or extra doses. What may interact with this medication? Do not take this medication with any of the following: Linezolid MAOIs, such as Azilect, Carbex, Eldepryl, Marplan, Nardil, and Parnate Methylene blue (injected into a vein) Other medications that contain bupropion, such as Wellbutrin This medication may also interact with the following: Alcohol Certain medications for anxiety or sleep Certain medications for blood pressure, such as metoprolol, propranolol Certain medications for HIV or AIDS, such as efavirenz, lopinavir, nelfinavir, ritonavir Certain medications for irregular heartbeat, such as propafenone, flecainide Certain medications for mental health conditions Certain medications for Parkinson disease, such as amantadine, levodopa Certain medications for seizures, such as carbamazepine, phenytoin, phenobarbital Cimetidine Clopidogrel Cyclophosphamide Digoxin Furazolidone Isoniazid Nicotine Orphenadrine Procarbazine Steroid medications, such as prednisone or cortisone Stimulant medications for attention disorders, weight loss, or to stay awake Tamoxifen Theophylline Thiotepa Ticlopidine Tramadol Warfarin This list may not describe all possible interactions. Give your health care provider a list of all the medicines, herbs, non-prescription drugs, or dietary supplements you use. Also tell them if you smoke, drink alcohol, or use illegal  drugs.  Some items may interact with your medicine. What should I watch for while using this medication? Visit your care team for regular checks on your progress. This medication should be used together with a patient support program. It is important to participate in a behavioral program, counseling, or other support program that is recommended by your care team. This medication may cause thoughts of suicide or depression. This includes sudden changes in mood, behaviors, or thoughts. These changes can happen at any time but are more common in the beginning of treatment or after a change in dose. Call your care team right away if you experience these thoughts or worsening depression. This medication may cause mood and behavior changes, such as anxiety, nervousness, irritability, hostility, restlessness, excitability, hyperactivity, or trouble sleeping. These changes can happen at any time but are more common in the beginning of treatment or after a change in dose. Call your care team right away if you notice any of these symptoms. This medication may cause serious skin reactions. They can happen weeks to months after starting the medication. Contact your care team right away if you notice fevers or flu-like symptoms with a rash. The rash may be red or purple and then turn into blisters or peeling of the skin. You may also notice a red rash with swelling of the face, lips, or lymph nodes in your neck or under your arms. Avoid drinks that contain alcohol while taking this medication. Drinking large amounts of alcohol, using sleeping or anxiety medications, or quickly stopping the use of these agents while taking this medication may increase your risk for a seizure. This medication may affect your coordination, reaction time, or judgment. Do not drive or operate machinery until you know how this medication affects you. Do not take this medication close to bedtime. It may prevent you from sleeping. Your mouth  may get dry. Chewing sugarless gum or sucking hard candy and drinking plenty of water may help. Contact your care team if the problem does not go away or is severe. Do not use nicotine patches or chewing gum without the advice of your care team while taking this medication. You may need to have your blood pressure taken regularly if your doctor recommends that you use both nicotine and this medication together. What side effects may I notice from receiving this medication? Side effects that you should report to your care team as soon as possible: Allergic reactions--skin rash, itching, hives, swelling of the face, lips, tongue, or throat Increase in blood pressure Mood and behavior changes--anxiety, nervousness, confusion, hallucinations, irritability, hostility, thoughts of suicide or self-harm, worsening mood, feelings of depression Redness, blistering, peeling, or loosening of the skin, including inside the mouth Seizures Sudden eye pain or change in vision such as blurry vision, seeing halos around lights, vision loss Side effects that usually do not require medical attention (report to your care team if they continue or are bothersome): Constipation Dizziness Dry mouth Loss of appetite Nausea Tremors or shaking Trouble sleeping This list may not describe all possible side effects. Call your doctor for medical advice about side effects. You may report side effects to FDA at 1-800-FDA-1088. Where should I keep my medication? Keep out of the reach of children and pets. Store at room temperature between 20 and 25 degrees C (68 and 77 degrees F). Protect from light. Keep container tightly closed. Throw away any unused medication after the expiration date. NOTE: This sheet is a summary. It may not cover all possible  information. If you have questions about this medicine, talk to your doctor, pharmacist, or health care provider.  2024 [employer]/Gold Standard (2022-06-03 00:00:00)

## 2024-06-30 ENCOUNTER — Other Ambulatory Visit: Payer: Self-pay | Admitting: Internal Medicine

## 2024-06-30 DIAGNOSIS — L578 Other skin changes due to chronic exposure to nonionizing radiation: Secondary | ICD-10-CM | POA: Diagnosis not present

## 2024-06-30 DIAGNOSIS — Z859 Personal history of malignant neoplasm, unspecified: Secondary | ICD-10-CM | POA: Diagnosis not present

## 2024-06-30 MED ORDER — BUPROPION HCL ER (SR) 150 MG PO TB12: 150.0000 mg | ORAL_TABLET | Freq: Two times a day (BID) | ORAL | 1 refills | Status: AC

## 2024-10-13 ENCOUNTER — Other Ambulatory Visit: Payer: Self-pay | Admitting: Internal Medicine

## 2024-10-13 DIAGNOSIS — Z1231 Encounter for screening mammogram for malignant neoplasm of breast: Secondary | ICD-10-CM

## 2024-11-12 ENCOUNTER — Encounter

## 2024-12-25 ENCOUNTER — Encounter: Admitting: Internal Medicine
